# Patient Record
Sex: Male | Born: 1990 | Race: Black or African American | Hispanic: No | Marital: Single | Smoking: Former smoker
Health system: Southern US, Community
[De-identification: ages and names within clinical notes are randomized; demographics above are authoritative.]

## PROBLEM LIST (undated history)

## (undated) DIAGNOSIS — T7840XA Allergy, unspecified, initial encounter: Secondary | ICD-10-CM

## (undated) DIAGNOSIS — J45909 Unspecified asthma, uncomplicated: Secondary | ICD-10-CM

## (undated) HISTORY — DX: Unspecified asthma, uncomplicated: J45.909

## (undated) HISTORY — DX: Allergy, unspecified, initial encounter: T78.40XA

---
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
## 2013-03-15 ENCOUNTER — Telehealth: Payer: Self-pay | Admitting: Internal Medicine

## 2013-03-15 NOTE — Telephone Encounter (Signed)
Pt has an appt 12.31.14.

## 2013-03-15 NOTE — Telephone Encounter (Signed)
ok 

## 2013-03-15 NOTE — Telephone Encounter (Signed)
Pt called stated that he scheduled for new pt last year 10/2011, but he had to cancel the appt due to school. Pt call request to reschedule for new pt and pt need to be seen for coughing maybe next week if this is ok with Dr. Yetta Barre. Please advise.

## 2013-03-20 ENCOUNTER — Ambulatory Visit (INDEPENDENT_AMBULATORY_CARE_PROVIDER_SITE_OTHER): Payer: BC Managed Care – PPO

## 2013-03-20 ENCOUNTER — Telehealth: Payer: Self-pay | Admitting: Internal Medicine

## 2013-03-20 ENCOUNTER — Ambulatory Visit (INDEPENDENT_AMBULATORY_CARE_PROVIDER_SITE_OTHER)
Admission: RE | Admit: 2013-03-20 | Discharge: 2013-03-20 | Disposition: A | Discharge status: Home / Self Care | Payer: BC Managed Care – PPO | Source: Ambulatory Visit | Attending: Internal Medicine | Admitting: Internal Medicine

## 2013-03-20 ENCOUNTER — Ambulatory Visit (INDEPENDENT_AMBULATORY_CARE_PROVIDER_SITE_OTHER): Payer: BC Managed Care – PPO | Admitting: Internal Medicine

## 2013-03-20 ENCOUNTER — Encounter: Payer: Self-pay | Admitting: Internal Medicine

## 2013-03-20 VITALS — BP 160/90 | HR 61 | Temp 98.7°F | Resp 16 | Ht 69.0 in | Wt 183.0 lb

## 2013-03-20 DIAGNOSIS — J4531 Mild persistent asthma with (acute) exacerbation: Secondary | ICD-10-CM

## 2013-03-20 DIAGNOSIS — J4520 Mild intermittent asthma, uncomplicated: Secondary | ICD-10-CM | POA: Insufficient documentation

## 2013-03-20 DIAGNOSIS — F1910 Other psychoactive substance abuse, uncomplicated: Secondary | ICD-10-CM

## 2013-03-20 DIAGNOSIS — R05 Cough: Secondary | ICD-10-CM

## 2013-03-20 DIAGNOSIS — R059 Cough, unspecified: Secondary | ICD-10-CM

## 2013-03-20 DIAGNOSIS — I10 Essential (primary) hypertension: Secondary | ICD-10-CM

## 2013-03-20 DIAGNOSIS — J45901 Unspecified asthma with (acute) exacerbation: Secondary | ICD-10-CM

## 2013-03-20 DIAGNOSIS — I517 Cardiomegaly: Secondary | ICD-10-CM

## 2013-03-20 LAB — CBC WITH DIFFERENTIAL/PLATELET
Eosinophils Absolute: 0.1 10*3/uL (ref 0.0–0.7)
Eosinophils Relative: 1 % (ref 0.0–5.0)
HCT: 46.9 % (ref 39.0–52.0)
Hemoglobin: 16.1 g/dL (ref 13.0–17.0)
Lymphocytes Relative: 36.2 % (ref 12.0–46.0)
Lymphs Abs: 2.5 10*3/uL (ref 0.7–4.0)
MCV: 95.5 fl (ref 78.0–100.0)
Monocytes Relative: 10.7 % (ref 3.0–12.0)
Neutro Abs: 3.6 10*3/uL (ref 1.4–7.7)
Platelets: 162 10*3/uL (ref 150.0–400.0)
WBC: 6.9 10*3/uL (ref 4.5–10.5)

## 2013-03-20 LAB — COMPREHENSIVE METABOLIC PANEL
CO2: 28 mEq/L (ref 19–32)
Calcium: 9.7 mg/dL (ref 8.4–10.5)
Creatinine, Ser: 0.9 mg/dL (ref 0.4–1.5)
GFR: 130.62 mL/min (ref >60.00)
Glucose, Bld: 95 mg/dL (ref 70–99)
Total Bilirubin: 0.5 mg/dL (ref 0.3–1.2)
Total Protein: 7.9 g/dL (ref 6.0–8.3)

## 2013-03-20 LAB — URINALYSIS, ROUTINE W REFLEX MICROSCOPIC
Bilirubin Urine: NEGATIVE
Hgb urine dipstick: NEGATIVE
Ketones, ur: NEGATIVE
Leukocytes, UA: NEGATIVE
Total Protein, Urine: NEGATIVE
Urine Glucose: NEGATIVE
Urobilinogen, UA: 0.2 (ref 0.0–1.0)

## 2013-03-20 LAB — LIPID PANEL
HDL: 49.2 mg/dL (ref >39.00)
LDL Cholesterol: 76 mg/dL (ref 0–99)
Total CHOL/HDL Ratio: 3
Triglycerides: 137 mg/dL (ref 0.0–149.0)
VLDL: 27.4 mg/dL (ref 0.0–40.0)

## 2013-03-20 LAB — TSH: TSH: 2.04 u[IU]/mL (ref 0.35–5.50)

## 2013-03-20 LAB — SEDIMENTATION RATE: Sed Rate: 6 mm/hr (ref 0–22)

## 2013-03-20 MED ORDER — NEBIVOLOL HCL 5 MG PO TABS: 5.0000 mg | ORAL_TABLET | Freq: Every day | ORAL | Status: DC

## 2013-03-20 MED ORDER — MOMETASONE FURO-FORMOTEROL FUM 100-5 MCG/ACT IN AERO: 2.0000 | INHALATION_SPRAY | Freq: Two times a day (BID) | RESPIRATORY_TRACT | Status: DC

## 2013-03-20 NOTE — Assessment & Plan Note (Signed)
His CXR is normal today

## 2013-03-20 NOTE — Assessment & Plan Note (Signed)
I have asked him to start using dulera and to stop smoking

## 2013-03-20 NOTE — Telephone Encounter (Signed)
Pt's mother called wanting information on her son's consult and test that he has been referred for.

## 2013-03-20 NOTE — Patient Instructions (Signed)
Hypertension As your heart beats, it forces blood through your arteries. This force is your blood pressure. If the pressure is too high, it is called hypertension (HTN) or high blood pressure. HTN is dangerous because you may have it and not know it. High blood pressure may mean that your heart has to work harder to pump blood. Your arteries may be narrow or stiff. The extra work puts you at risk for heart disease, stroke, and other problems.  Blood pressure consists of two numbers, a higher number over a lower, 110/72, for example. It is stated as "110 over 72." The ideal is below 120 for the top number (systolic) and under 80 for the bottom (diastolic). Write down your blood pressure today. You should pay close attention to your blood pressure if you have certain conditions such as:  Heart failure.  Prior heart attack.  Diabetes  Chronic kidney disease.  Prior stroke.  Multiple risk factors for heart disease. To see if you have HTN, your blood pressure should be measured while you are seated with your arm held at the level of the heart. It should be measured at least twice. A one-time elevated blood pressure reading (especially in the Emergency Department) does not mean that you need treatment. There may be conditions in which the blood pressure is different between your right and left arms. It is important to see your caregiver soon for a recheck. Most people have essential hypertension which means that there is not a specific cause. This type of high blood pressure may be lowered by changing lifestyle factors such as:  Stress.  Smoking.  Lack of exercise.  Excessive weight.  Drug/tobacco/alcohol use.  Eating less salt. Most people do not have symptoms from high blood pressure until it has caused damage to the body. Effective treatment can often prevent, delay or reduce that damage. TREATMENT  When a cause has been identified, treatment for high blood pressure is directed at the  cause. There are a large number of medications to treat HTN. These fall into several categories, and your caregiver will help you select the medicines that are best for you. Medications may have side effects. You should review side effects with your caregiver. If your blood pressure stays high after you have made lifestyle changes or started on medicines,   Your medication(s) may need to be changed.  Other problems may need to be addressed.  Be certain you understand your prescriptions, and know how and when to take your medicine.  Be sure to follow up with your caregiver within the time frame advised (usually within two weeks) to have your blood pressure rechecked and to review your medications.  If you are taking more than one medicine to lower your blood pressure, make sure you know how and at what times they should be taken. Taking two medicines at the same time can result in blood pressure that is too low. SEEK IMMEDIATE MEDICAL CARE IF:  You develop a severe headache, blurred or changing vision, or confusion.  You have unusual weakness or numbness, or a faint feeling.  You have severe chest or abdominal pain, vomiting, or breathing problems. MAKE SURE YOU:   Understand these instructions.  Will watch your condition.  Will get help right away if you are not doing well or get worse. Document Released: 03/07/2005 Document Revised: 05/30/2011 Document Reviewed: 10/26/2007 [facility] Patient Information 2014 [place], [place]. Asthma, Adult Asthma is a recurring condition in which the airways tighten and narrow. Asthma can  make it difficult to breathe. It can cause coughing, wheezing, and shortness of breath. Asthma episodes (also called asthma attacks) range from minor to life-threatening. Asthma cannot be cured, but medicines and lifestyle changes can help control it. CAUSES Asthma is believed to be caused by inherited (genetic) and environmental factors, but its exact cause is unknown.  Asthma may be triggered by allergens, lung infections, or irritants in the air. Asthma triggers are different for each person. Common triggers include:   Animal dander.  Dust mites.  Cockroaches.  Pollen from trees or grass.  Mold.  Smoke.  Air pollutants such as dust, household cleaners, hair sprays, aerosol sprays, paint fumes, strong chemicals, or strong odors.  Cold air, weather changes, and winds (which increase molds and pollens in the air).  Strong emotional expressions such as crying or laughing hard.  Stress.  Certain medicines (such as aspirin) or types of drugs (such as beta-blockers).  Sulfites in foods and drinks. Foods and drinks that may contain sulfites include dried fruit, potato chips, and sparkling grape juice.  Infections or inflammatory conditions such as the flu, a cold, or an inflammation of the nasal membranes (rhinitis).  Gastroesophageal reflux disease (GERD).  Exercise or strenuous activity. SYMPTOMS Symptoms may occur immediately after asthma is triggered or many hours later. Symptoms include:  Wheezing.  Excessive nighttime or early morning coughing.  Frequent or severe coughing with a common cold.  Chest tightness.  Shortness of breath. DIAGNOSIS  The diagnosis of asthma is made by a review of your medical history and a physical exam. Tests may also be performed. These may include:  Lung function studies. These tests show how much air you breath in and out.  Allergy tests.  Imaging tests such as X-rays. TREATMENT  Asthma cannot be cured, but it can usually be controlled. Treatment involves identifying and avoiding your asthma triggers. It also involves medicines. There are 2 classes of medicine used for asthma treatment:   Controller medicines. These prevent asthma symptoms from occurring. They are usually taken every day.  Reliever or rescue medicines. These quickly relieve asthma symptoms. They are used as needed and provide  short-term relief. Your health care provider will help you create an asthma action plan. An asthma action plan is a written plan for managing and treating your asthma attacks. It includes a list of your asthma triggers and how they may be avoided. It also includes information on when medicines should be taken and when their dosage should be changed. An action plan may also involve the use of a device called a peak flow meter. A peak flow meter measures how well the lungs are working. It helps you monitor your condition. HOME CARE INSTRUCTIONS   Take medicine as directed by your health care provider. Speak with your health care provider if you have questions about how or when to take the medicines.  Use a peak flow meter as directed by your health care provider. Record and keep track of readings.  Understand and use the action plan to help minimize or stop an asthma attack without needing to seek medical care.  Control your home environment in the following ways to help prevent asthma attacks:  Do not smoke. Avoid being exposed to secondhand smoke.  Change your heating and air conditioning filter regularly.  Limit your use of fireplaces and wood stoves.  Get rid of pests (such as roaches and mice) and their droppings.  Throw away plants if you see mold on them.  Clean your floors and dust regularly. Use unscented cleaning products.  Try to have someone else vacuum for you regularly. Stay out of rooms while they are being vacuumed and for a short while afterward. If you vacuum, use a dust mask from a hardware store, a double-layered or microfilter vacuum cleaner bag, or a vacuum cleaner with a HEPA filter.  Replace carpet with wood, tile, or vinyl flooring. Carpet can trap dander and dust.  Use allergy-proof pillows, mattress covers, and box spring covers.  Wash bed sheets and blankets every week in hot water and dry them in a dryer.  Use blankets that are made of polyester or  cotton.  Clean bathrooms and kitchens with bleach. If possible, have someone repaint the walls in these rooms with mold-resistant paint. Keep out of the rooms that are being cleaned and painted.  Wash hands frequently. SEEK MEDICAL CARE IF:   You have wheezing, shortness of breath, or a cough even if taking medicine to prevent attacks.  The colored mucus you cough up (sputum) is thicker than usual.  Your sputum changes from clear or white to yellow, green, gray, or bloody.  You have any problems that may be related to the medicines you are taking (such as a rash, itching, swelling, or trouble breathing).  You are using a reliever medicine more than 2 3 times per week.  Your peak flow is still at 50 79% of you personal best after following your action plan for 1 hour. SEEK IMMEDIATE MEDICAL CARE IF:   You seem to be getting worse and are unresponsive to treatment during an asthma attack.  You are short of breath even at rest.  You get short of breath when doing very little physical activity.  You have difficulty eating, drinking, or talking due to asthma symptoms.  You develop chest pain.  You develop a fast heartbeat.  You have a bluish color to your lips or fingernails.  You are lightheaded, dizzy, or faint.  Your peak flow is less than 50% of your personal best.  You have a fever or persistent symptoms for more than 2 3 days.  You have a fever and symptoms suddenly get worse. MAKE SURE YOU:   Understand these instructions.  Will watch your condition.  Will get help right away if you are not doing well or get worse. Document Released: 03/07/2005 Document Revised: 11/07/2012 Document Reviewed: 10/04/2012 [facility] Patient Information 2014 [place], [place].

## 2013-03-20 NOTE — Progress Notes (Signed)
Subjective:    Patient ID: Joseph Ferrell, male    DOB: 11/11/1990, 22 y.o.   MRN: [id number]  Hypertension Pertinent negatives include no chest pain, headaches, neck pain, palpitations, shortness of breath or sweats.  Cough This is a chronic problem. The current episode started more than 1 year ago. The problem has been unchanged. The problem occurs every few hours. The cough is non-productive. Associated symptoms include postnasal drip, rhinorrhea and wheezing. Pertinent negatives include no chest pain, chills, ear congestion, ear pain, fever, headaches, heartburn, hemoptysis, myalgias, nasal congestion, rash, sore throat, shortness of breath, sweats or weight loss. The symptoms are aggravated by cold air, exercise and pollens. Risk factors for lung disease include smoking/tobacco exposure. He has tried nothing for the symptoms. His past medical history is significant for asthma. There is no history of bronchiectasis, bronchitis, COPD, emphysema or pneumonia.      Review of Systems  Constitutional: Negative.  Negative for fever, chills, weight loss, diaphoresis, activity change, appetite change, fatigue and unexpected weight change.  HENT: Positive for congestion, postnasal drip and rhinorrhea. Negative for dental problem, drooling, ear discharge, ear pain, facial swelling, hearing loss, mouth sores, nosebleeds, sinus pressure, sneezing, sore throat, tinnitus, trouble swallowing and voice change.   Eyes: Negative.   Respiratory: Positive for cough and wheezing. Negative for apnea, hemoptysis, choking, chest tightness and shortness of breath.   Cardiovascular: Negative.  Negative for chest pain, palpitations and leg swelling.  Gastrointestinal: Negative.  Negative for heartburn, nausea, vomiting, abdominal pain, diarrhea, constipation and blood in stool.  Endocrine: Negative.   Genitourinary: Negative.   Musculoskeletal: Negative.  Negative for myalgias, neck pain and neck stiffness.  Skin:  Negative.  Negative for rash.  Allergic/Immunologic: Negative.   Neurological: Negative.  Negative for dizziness and headaches.  Hematological: Negative.  Negative for adenopathy. Does not bruise/bleed easily.  Psychiatric/Behavioral: Negative.        Objective:   Physical Exam  Vitals reviewed. Constitutional: He is oriented to person, place, and time. He appears well-developed and well-nourished.  Non-toxic appearance. He does not have a sickly appearance. He does not appear ill. No distress.  HENT:  Head: Normocephalic and atraumatic.  Mouth/Throat: Oropharynx is clear and moist. No oropharyngeal exudate.  Eyes: Conjunctivae are normal. Right eye exhibits no discharge. Left eye exhibits no discharge. No scleral icterus.  Neck: Normal range of motion. Neck supple. No JVD present. No tracheal deviation present. No thyromegaly present.  Cardiovascular: Normal rate, regular rhythm, S1 normal, S2 normal, normal heart sounds and intact distal pulses.  Exam reveals no gallop and no friction rub.   No murmur heard.  No systolic murmur is present   No diastolic murmur is present  Pulses:      Carotid pulses are 2+ on the right side, and 2+ on the left side.      Radial pulses are 2+ on the right side, and 2+ on the left side.       Femoral pulses are 2+ on the right side, and 2+ on the left side.      Popliteal pulses are 2+ on the right side, and 2+ on the left side.       Dorsalis pedis pulses are 2+ on the right side, and 2+ on the left side.       Posterior tibial pulses are 2+ on the right side, and 2+ on the left side.  Pulmonary/Chest: Effort normal and breath sounds normal. No stridor. No respiratory distress. He  has no wheezes. He has no rales. He exhibits no tenderness.  Abdominal: Soft. Bowel sounds are normal. He exhibits no distension and no mass. There is no tenderness. There is no rebound and no guarding.  Musculoskeletal: Normal range of motion. He exhibits no edema and no  tenderness.  Lymphadenopathy:    He has no cervical adenopathy.  Neurological: He is oriented to person, place, and time.  Skin: Skin is warm and dry. No rash noted. He is not diaphoretic. No erythema. No pallor.  Psychiatric: He has a normal mood and affect. His behavior is normal. Judgment and thought content normal.          Assessment & Plan:

## 2013-03-20 NOTE — Assessment & Plan Note (Signed)
His EKG shows LVH so I have asked him to start bystolic I will check his labs to look for end organ damage and secondary causes of HTN I have asked him to stop smoking and abusing alcohol

## 2013-03-20 NOTE — Progress Notes (Signed)
Pre visit review using our clinic review tool, if applicable. No additional management support is needed unless otherwise documented below in the visit note. 

## 2013-03-20 NOTE — Assessment & Plan Note (Signed)
This was apparent on his EKG today I have asked him to have an ECHO done to see if he has HOCM and to see cardiology for further evaluation

## 2013-03-20 NOTE — Assessment & Plan Note (Signed)
I will check his UDS today to see if he is using anything that would cause HTN Also, I have asked to see someone for substance abuse counseling

## 2013-03-23 LAB — DRUGS OF ABUSE SCREEN W/O ALC, ROUTINE URINE
Amphetamine Screen, Ur: NEGATIVE
Barbiturate Quant, Ur: NEGATIVE
Benzodiazepines.: NEGATIVE
Cocaine Metabolites: NEGATIVE
Creatinine,U: 131.3 mg/dL
Marijuana Metabolite: POSITIVE — AB
Methadone: NEGATIVE
Opiate Screen, Urine: NEGATIVE
Phencyclidine (PCP): NEGATIVE
Propoxyphene: NEGATIVE

## 2013-03-26 LAB — CANNABANOIDS (GC/LC/MS), URINE: THC-COOH (GC/LC/MS), ur confirm: 321 ng/mL

## 2013-04-03 ENCOUNTER — Ambulatory Visit (INDEPENDENT_AMBULATORY_CARE_PROVIDER_SITE_OTHER): Payer: BC Managed Care – PPO | Admitting: Interventional Cardiology

## 2013-04-03 ENCOUNTER — Encounter: Payer: Self-pay | Admitting: Interventional Cardiology

## 2013-04-03 VITALS — BP 130/82 | HR 64 | Ht 68.0 in | Wt 183.0 lb

## 2013-04-03 DIAGNOSIS — F17200 Nicotine dependence, unspecified, uncomplicated: Secondary | ICD-10-CM

## 2013-04-03 DIAGNOSIS — R030 Elevated blood-pressure reading, without diagnosis of hypertension: Secondary | ICD-10-CM

## 2013-04-03 DIAGNOSIS — R0602 Shortness of breath: Secondary | ICD-10-CM

## 2013-04-03 NOTE — Patient Instructions (Signed)
Your physician has requested that you regularly monitor and record your blood pressure readings at home. Please use the same machine at the same time of day to check your readings and record them to bring to your follow-up visit.  Your physician recommends that you schedule a follow-up appointment in 4 weeks with Dr. Eldridge DaceVaranasi.  Echocardiogram scheduled for 04/05/13.

## 2013-04-03 NOTE — Progress Notes (Signed)
Patient ID: Joseph HailstoneKevin R Ferrell, male   DOB: 05/14/1990, 23 y.o.   MRN: [id number]    [address] [place], [place]  [address] Phone: [phone] Fax:  [phone]  Date:  04/03/2013   ID:  Joseph HailstoneKevin R Ferrell, DOB 07/15/1990, MRN [id number]  PCP:  Sanda Lingerhomas Jones, MD      History of Present Illness: Joseph Ferrell is a 23 y.o. male who saw his PMD for a presumed asthma exacerbation, related to smoking.  He had an ECG due to some CP and SHOB.  It was concerning for LVH.  THe patient is here for further evaluation.  There was a concern for HOCM as well based on ECG.  The patient denies chest discomfort and SHOB except for when he had a URI.  No regular exercise.  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  Never had problems with activity.  No family h/o of sudden death in very young people.  GrandFather died at 1741, MI.  GrandMother died or CAD at 4348.   The patient denies any chest discomfort at this time. He has not had any syncope or near syncope. He denies palpitations.  He had asthma as a child and that was his only restriction.  Used inhaler for asthma, but outgrew this.     Wt Readings from Last 3 Encounters:  04/03/13 183 lb (83.008 kg)  03/20/13 183 lb (83.008 kg)     Past Medical History  Diagnosis Date  . Allergy   . Asthma     Current Outpatient Prescriptions  Medication Sig Dispense Refill  . mometasone-formoterol (DULERA) 100-5 MCG/ACT AERO Inhale 2 puffs into the lungs 2 (two) times daily.  1 Inhaler  11  . nebivolol (BYSTOLIC) 5 MG tablet Take 1 tablet (5 mg total) by mouth daily.  35 tablet  0   No current facility-administered medications for this visit.    Allergies:   No Known Allergies  Social History:  The patient  reports that he quit smoking 7 days ago. His smoking use included Cigarettes and Cigars. He has a 4 pack-year smoking history. He does not have any smokeless tobacco history on file. He reports that he drinks about 7.5 ounces of  alcohol per week. He reports that he uses illicit drugs (Marijuana).   Family History:  The patient's family history includes Alcohol abuse in his father; Hypertension in his father and mother.   ROS:  Please see the history of present illness.  No nausea, vomiting.  No fevers, chills.  No focal weakness.  No dysuria.   All other systems reviewed and negative.   PHYSICAL EXAM: VS:  BP 130/82  Pulse 64  Ht 5\' 8"  (1.727 m)  Wt 183 lb (83.008 kg)  BMI 27.83 kg/m2 Well nourished, well developed, in no acute distress HEENT: normal Neck: no JVD, no carotid bruits Cardiac:  normal S1, S2; RRR;  Lungs:  clear to auscultation bilaterally, no wheezing, rhonchi or rales Abd: soft, nontender, no hepatomegaly Ext: no edema Skin: warm and dry Neuro:   no focal abnormalities noted  EKG:  Normal sinus rhythm, increased voltage which may be normal variant given his age    ASSESSMENT AND PLAN:  1. Question of abnormal ECG: He does have an increased QRS voltage. Given his age, this may be a normal variant. An echocardiogram has already been ordered by his primary care physician and will be performed on Friday. We'll see if  he really has LVH or any type of septal hypertrophy that would be concerning. 2. Elevated blood pressure: There several lifestyle habits that he can improve upon which would lower his blood pressure. He has been drinking heavily. He is trying to cut back with this. I asked him to decrease caffeine intake as well. He can continue his bystolic at this point, but hopefully, we'll be able to stop this medication. Given his young age, I don't want to commit him to long-term blood pressure medicines. I've asked him to check his blood pressure outside of the doctor's office. He will also keep a record of this. Workup for possible causes of secondary hypertension is in progress. 3. Tobacco abuse: He needs to stop smoking.  Also, urine drug screen was positive for marijuana.  Signed, Fredric Mare, MD, [facility] 04/03/2013 1:44 PM

## 2013-04-05 ENCOUNTER — Encounter: Payer: Self-pay | Admitting: Cardiovascular Disease

## 2013-04-05 ENCOUNTER — Ambulatory Visit (HOSPITAL_COMMUNITY): Payer: BC Managed Care – PPO | Attending: Cardiovascular Disease | Admitting: Cardiology

## 2013-04-05 DIAGNOSIS — I517 Cardiomegaly: Secondary | ICD-10-CM | POA: Insufficient documentation

## 2013-04-05 DIAGNOSIS — R0609 Other forms of dyspnea: Secondary | ICD-10-CM | POA: Insufficient documentation

## 2013-04-05 DIAGNOSIS — F17200 Nicotine dependence, unspecified, uncomplicated: Secondary | ICD-10-CM | POA: Insufficient documentation

## 2013-04-05 DIAGNOSIS — R0989 Other specified symptoms and signs involving the circulatory and respiratory systems: Secondary | ICD-10-CM | POA: Insufficient documentation

## 2013-04-05 DIAGNOSIS — I10 Essential (primary) hypertension: Secondary | ICD-10-CM | POA: Insufficient documentation

## 2013-04-05 NOTE — Progress Notes (Signed)
Echo performed. 

## 2013-05-06 ENCOUNTER — Ambulatory Visit (INDEPENDENT_AMBULATORY_CARE_PROVIDER_SITE_OTHER): Payer: BC Managed Care – PPO | Admitting: Internal Medicine

## 2013-05-06 ENCOUNTER — Ambulatory Visit: Payer: BC Managed Care – PPO | Admitting: Interventional Cardiology

## 2013-05-06 ENCOUNTER — Encounter: Payer: Self-pay | Admitting: Internal Medicine

## 2013-05-06 VITALS — BP 110/78 | HR 72 | Temp 97.9°F | Resp 16 | Wt 185.1 lb

## 2013-05-06 DIAGNOSIS — I517 Cardiomegaly: Secondary | ICD-10-CM

## 2013-05-06 DIAGNOSIS — I10 Essential (primary) hypertension: Secondary | ICD-10-CM

## 2013-05-06 DIAGNOSIS — Z23 Encounter for immunization: Secondary | ICD-10-CM

## 2013-05-06 MED ORDER — NEBIVOLOL HCL 5 MG PO TABS: 5.0000 mg | ORAL_TABLET | Freq: Every day | ORAL | Status: DC

## 2013-05-06 NOTE — Progress Notes (Signed)
   Subjective:    Patient ID: Joseph Ferrell, male    DOB: 04/15/1990, 23 y.o.   MRN: [id number]  Hypertension This is a chronic problem. The current episode started more than 1 year ago. The problem has been gradually improving since onset. The problem is controlled. Pertinent negatives include no anxiety, blurred vision, chest pain, headaches, malaise/fatigue, neck pain, orthopnea, palpitations, peripheral edema, PND, shortness of breath or sweats. Past treatments include beta blockers. The current treatment provides moderate improvement. There are no compliance problems.       Review of Systems  Constitutional: Negative.  Negative for fever, chills, malaise/fatigue, diaphoresis, appetite change and fatigue.  HENT: Negative.   Eyes: Negative.  Negative for blurred vision.  Respiratory: Negative.  Negative for cough, choking, chest tightness, shortness of breath, wheezing and stridor.   Cardiovascular: Negative.  Negative for chest pain, palpitations, orthopnea, leg swelling and PND.  Gastrointestinal: Negative.  Negative for nausea, vomiting, abdominal pain, diarrhea, constipation and blood in stool.  Endocrine: Negative.   Genitourinary: Negative.   Musculoskeletal: Negative.  Negative for neck pain.  Skin: Negative.   Allergic/Immunologic: Negative.   Neurological: Negative.  Negative for dizziness, syncope, weakness and headaches.  Hematological: Negative.  Negative for adenopathy. Does not bruise/bleed easily.  Psychiatric/Behavioral: Negative.        Objective:   Physical Exam  Vitals reviewed. Constitutional: He is oriented to person, place, and time. He appears well-developed and well-nourished. No distress.  HENT:  Head: Normocephalic and atraumatic.  Mouth/Throat: Oropharynx is clear and moist. No oropharyngeal exudate.  Eyes: Conjunctivae are normal. Right eye exhibits no discharge. Left eye exhibits no discharge. No scleral icterus.  Neck: Normal range of motion. Neck  supple. No JVD present. No tracheal deviation present. No thyromegaly present.  Cardiovascular: Normal rate and intact distal pulses.  Exam reveals no gallop and no friction rub.   No murmur heard. Pulmonary/Chest: Effort normal and breath sounds normal. No stridor. No respiratory distress. He has no wheezes. He has no rales. He exhibits no tenderness.  Abdominal: Soft. Bowel sounds are normal. He exhibits no distension and no mass. There is no tenderness. There is no rebound and no guarding.  Musculoskeletal: Normal range of motion. He exhibits no edema.  Lymphadenopathy:    He has no cervical adenopathy.  Neurological: He is oriented to person, place, and time.  Skin: Skin is warm and dry. No rash noted. He is not diaphoretic. No erythema. No pallor.      Lab Results  Component Value Date   WBC 6.9 03/20/2013   HGB 16.1 03/20/2013   HCT 46.9 03/20/2013   PLT 162.0 03/20/2013   GLUCOSE 95 03/20/2013   CHOL 153 03/20/2013   TRIG 137.0 03/20/2013   HDL 49.20 03/20/2013   LDLCALC 76 03/20/2013   ALT 60* 03/20/2013   AST 36 03/20/2013   NA 137 03/20/2013   K 4.5 03/20/2013   CL 104 03/20/2013   CREATININE 0.9 03/20/2013   BUN 12 03/20/2013   CO2 28 03/20/2013   TSH 2.04 03/20/2013      Assessment & Plan:

## 2013-05-06 NOTE — Assessment & Plan Note (Signed)
His BP is well controlled Will cont bystolic for now

## 2013-05-06 NOTE — Progress Notes (Signed)
Pre-visit discussion using our clinic review tool. No additional management support is needed unless otherwise documented below in the visit note.  

## 2013-05-06 NOTE — Patient Instructions (Signed)

## 2013-05-27 ENCOUNTER — Encounter: Payer: Self-pay | Admitting: Interventional Cardiology

## 2013-05-27 ENCOUNTER — Ambulatory Visit (INDEPENDENT_AMBULATORY_CARE_PROVIDER_SITE_OTHER): Payer: BC Managed Care – PPO | Admitting: Interventional Cardiology

## 2013-05-27 VITALS — BP 122/71 | HR 62 | Ht 69.0 in | Wt 181.0 lb

## 2013-05-27 DIAGNOSIS — I10 Essential (primary) hypertension: Secondary | ICD-10-CM

## 2013-05-27 NOTE — Patient Instructions (Signed)
Your physician recommends that you continue on your current medications as directed. Please refer to the Current Medication list given to you today.  Your physician wants you to follow-up in: 1 year with Dr. Varanasi. You will receive a reminder letter in the mail two months in advance. If you don't receive a letter, please call our office to schedule the follow-up appointment.  

## 2013-05-27 NOTE — Progress Notes (Signed)
Patient ID: Joseph Ferrell, male   DOB: 1990-12-07, 23 y.o.   MRN: [id number] Patient ID: Joseph Ferrell, male   DOB: 1990-09-02, 23 y.o.   MRN: [id number]    [address] [place], [place]  [address] Phone: [phone] Fax:  [phone]  Date:  05/27/2013   ID:  Joseph Ferrell, DOB [birth year], MRN [id number]  PCP:  Sanda Linger, MD      History of Present Illness: Joseph Ferrell is a 23 y.o. male who saw his PMD for a presumed asthma exacerbation, related to smoking.  He had an ECG due to some CP and SHOB.  It was concerning for LVH.  THe patient is here for further evaluation.  There was a concern for HOCM as well based on ECG.  The patient denies chest discomfort and SHOB except for when he had a URI.  No regular exercise.  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  Never had problems with activity.  No family h/o of sudden death in very young people.  GrandFather died at 36, MI.  GrandMother died or CAD at 30.   The patient denies any chest discomfort at this time. He has not had any syncope or near syncope. He denies palpitations.  He had asthma as a child and that was his only restriction.  Used inhaler for asthma, but outgrew this.    He was started on bystolic.  He has felt well.  His BP was checked outside of the MDs office.  He has obtained readings but does not remember the exact readings; he thinks they were ok.    Wt Readings from Last 3 Encounters:  05/27/13 181 lb (82.101 kg)  05/06/13 185 lb 2 oz (83.972 kg)  04/03/13 183 lb (83.008 kg)     Past Medical History  Diagnosis Date  . Allergy   . Asthma     Current Outpatient Prescriptions  Medication Sig Dispense Refill  . loratadine (CLARITIN) 10 MG tablet Take 10 mg by mouth daily.      . mometasone-formoterol (DULERA) 100-5 MCG/ACT AERO Inhale 2 puffs into the lungs 2 (two) times daily.  1 Inhaler  11  . nebivolol (BYSTOLIC) 5 MG tablet Take 1 tablet (5 mg total) by mouth daily.  90 tablet  3    No current facility-administered medications for this visit.    Allergies:   No Known Allergies  Social History:  The patient  reports that he has been smoking Cigarettes and Cigars.  He has a 4 pack-year smoking history. He has never used smokeless tobacco. He reports that he drinks about 7.5 ounces of alcohol per week. He reports that he uses illicit drugs (Marijuana).   Family History:  The patient's family history includes Alcohol abuse in his father; Hypertension in his father and mother.   ROS:  Please see the history of present illness.  No nausea, vomiting.  No fevers, chills.  No focal weakness.  No dysuria.   All other systems reviewed and negative.   PHYSICAL EXAM: VS:  BP 122/71  Pulse 62  Ht 5\' 9"  (1.753 m)  Wt 181 lb (82.101 kg)  BMI 26.72 kg/m2 Well nourished, well developed, in no acute distress HEENT: normal Neck: no JVD, no carotid bruits Cardiac:  normal S1, S2; RRR;  Lungs:  clear to auscultation bilaterally, no wheezing, rhonchi or rales Abd: soft, nontender, no hepatomegaly Ext: no edema Skin: warm and dry  Neuro:   no focal abnormalities noted  EKG:  Normal sinus rhythm, increased voltage which may be normal variant given his age    ASSESSMENT AND PLAN:  1. Question of abnormal ECG: He does have an increased QRS voltage. This may be a normal variant. An echocardiogram has  been done and showed no LVH or any type of septal hypertrophy that would be concerning.  2. Elevated blood pressure: There several lifestyle habits that he can improve upon which would lower his blood pressure. He has been drinking heavily. He is trying to cut back with this. I asked him to decrease caffeine intake as well. He can continue his bystolic at this point, but hopefully, we'll be able to stop this medication. Given his young age, I don't want to commit him to long-term blood pressure medicines. I've asked him to check his blood pressure outside of the doctor's office. He will  also keep a record of this.   3. Tobacco abuse: He needs to stop smoking.  Also, urine drug screen was positive for marijuana in the past.  Signed, Fredric MareJay S. Indy Kuck, MD, [facility] 05/27/2013 12:26 PM

## 2014-05-23 ENCOUNTER — Other Ambulatory Visit (INDEPENDENT_AMBULATORY_CARE_PROVIDER_SITE_OTHER): Payer: BC Managed Care – PPO

## 2014-05-23 ENCOUNTER — Encounter: Payer: Self-pay | Admitting: Internal Medicine

## 2014-05-23 ENCOUNTER — Ambulatory Visit (INDEPENDENT_AMBULATORY_CARE_PROVIDER_SITE_OTHER): Payer: BC Managed Care – PPO | Admitting: Internal Medicine

## 2014-05-23 VITALS — BP 154/100 | HR 96 | Temp 98.3°F | Resp 16 | Ht 69.0 in | Wt 199.5 lb

## 2014-05-23 DIAGNOSIS — I10 Essential (primary) hypertension: Secondary | ICD-10-CM

## 2014-05-23 DIAGNOSIS — Z0000 Encounter for general adult medical examination without abnormal findings: Secondary | ICD-10-CM

## 2014-05-23 DIAGNOSIS — Z23 Encounter for immunization: Secondary | ICD-10-CM

## 2014-05-23 DIAGNOSIS — I517 Cardiomegaly: Secondary | ICD-10-CM

## 2014-05-23 DIAGNOSIS — J4520 Mild intermittent asthma, uncomplicated: Secondary | ICD-10-CM

## 2014-05-23 LAB — COMPREHENSIVE METABOLIC PANEL
ALBUMIN: 4.5 g/dL (ref 3.5–5.2)
ALT: 35 U/L (ref 0–53)
AST: 23 U/L (ref 0–37)
Alkaline Phosphatase: 55 U/L (ref 39–117)
BILIRUBIN TOTAL: 0.4 mg/dL (ref 0.2–1.2)
BUN: 10 mg/dL (ref 6–23)
CO2: 29 mEq/L (ref 19–32)
Calcium: 9.4 mg/dL (ref 8.4–10.5)
Chloride: 105 mEq/L (ref 96–112)
Creatinine, Ser: 1.02 mg/dL (ref 0.40–1.50)
GFR: 117.54 mL/min (ref >60.00)
GLUCOSE: 93 mg/dL (ref 70–99)
Potassium: 4.4 mEq/L (ref 3.5–5.1)
SODIUM: 137 meq/L (ref 135–145)
TOTAL PROTEIN: 7.2 g/dL (ref 6.0–8.3)

## 2014-05-23 LAB — LIPID PANEL
CHOL/HDL RATIO: 2
Cholesterol: 112 mg/dL (ref 0–200)
HDL: 53.7 mg/dL (ref >39.00)
LDL Cholesterol: 47 mg/dL (ref 0–99)
NONHDL: 58.3
Triglycerides: 58 mg/dL (ref 0.0–149.0)
VLDL: 11.6 mg/dL (ref 0.0–40.0)

## 2014-05-23 LAB — CBC WITH DIFFERENTIAL/PLATELET
BASOS ABS: 0 10*3/uL (ref 0.0–0.1)
Basophils Relative: 0.3 % (ref 0.0–3.0)
EOS ABS: 0.1 10*3/uL (ref 0.0–0.7)
Eosinophils Relative: 1.2 % (ref 0.0–5.0)
HEMATOCRIT: 46.5 % (ref 39.0–52.0)
Hemoglobin: 16 g/dL (ref 13.0–17.0)
LYMPHS ABS: 2.2 10*3/uL (ref 0.7–4.0)
Lymphocytes Relative: 35.3 % (ref 12.0–46.0)
MCHC: 34.4 g/dL (ref 30.0–36.0)
MCV: 95.8 fl (ref 78.0–100.0)
MONO ABS: 0.8 10*3/uL (ref 0.1–1.0)
Monocytes Relative: 12.1 % — ABNORMAL HIGH (ref 3.0–12.0)
NEUTROS PCT: 51.1 % (ref 43.0–77.0)
Neutro Abs: 3.2 10*3/uL (ref 1.4–7.7)
Platelets: 181 10*3/uL (ref 150.0–400.0)
RBC: 4.85 Mil/uL (ref 4.22–5.81)
RDW: 13.2 % (ref 11.5–15.5)
WBC: 6.2 10*3/uL (ref 4.0–10.5)

## 2014-05-23 LAB — URINALYSIS, ROUTINE W REFLEX MICROSCOPIC
Bilirubin Urine: NEGATIVE
HGB URINE DIPSTICK: NEGATIVE
KETONES UR: NEGATIVE
Leukocytes, UA: NEGATIVE
Nitrite: NEGATIVE
RBC / HPF: NONE SEEN (ref 0–?)
Specific Gravity, Urine: 1.02 (ref 1.000–1.030)
TOTAL PROTEIN, URINE-UPE24: NEGATIVE
URINE GLUCOSE: NEGATIVE
UROBILINOGEN UA: 0.2 (ref 0.0–1.0)
pH: 6.5 (ref 5.0–8.0)

## 2014-05-23 LAB — TSH: TSH: 0.75 u[IU]/mL (ref 0.35–4.50)

## 2014-05-23 MED ORDER — LORATADINE 10 MG PO TABS: 10.0000 mg | ORAL_TABLET | Freq: Every day | ORAL | Status: DC

## 2014-05-23 MED ORDER — NEBIVOLOL HCL 5 MG PO TABS: 5.0000 mg | ORAL_TABLET | Freq: Every day | ORAL | Status: DC

## 2014-05-23 MED ORDER — MOMETASONE FURO-FORMOTEROL FUM 100-5 MCG/ACT IN AERO: 2.0000 | INHALATION_SPRAY | Freq: Two times a day (BID) | RESPIRATORY_TRACT | Status: DC

## 2014-05-23 NOTE — Patient Instructions (Signed)

## 2014-05-23 NOTE — Progress Notes (Signed)
Pre visit review using our clinic review tool, if applicable. No additional management support is needed unless otherwise documented below in the visit note. 

## 2014-05-23 NOTE — Progress Notes (Signed)
   Subjective:    Patient ID: Joseph Ferrell, male    DOB: 07/29/1990, 24 y.o.   MRN: [id number]  Hypertension This is a chronic problem. The problem is unchanged. The problem is uncontrolled. Pertinent negatives include no anxiety, blurred vision, chest pain, headaches, malaise/fatigue, neck pain, orthopnea, palpitations, peripheral edema, PND, shortness of breath or sweats. Past treatments include beta blockers. The current treatment provides no improvement. Compliance problems include psychosocial issues (he has not been taking the bystolic).  Hypertensive end-organ damage includes left ventricular hypertrophy.      Review of Systems  Constitutional: Negative.  Negative for fever, chills, malaise/fatigue, diaphoresis, appetite change and fatigue.  HENT: Negative.   Eyes: Negative.  Negative for blurred vision.  Respiratory: Negative.  Negative for cough, choking, chest tightness, shortness of breath and stridor.   Cardiovascular: Negative.  Negative for chest pain, palpitations, orthopnea, leg swelling and PND.  Gastrointestinal: Negative.  Negative for nausea, vomiting, abdominal pain, diarrhea, constipation and blood in stool.  Endocrine: Negative.   Genitourinary: Negative.   Musculoskeletal: Negative.  Negative for myalgias, back pain, arthralgias and neck pain.  Skin: Negative.  Negative for rash.  Allergic/Immunologic: Negative.   Neurological: Negative.  Negative for headaches.  Hematological: Negative.  Negative for adenopathy. Does not bruise/bleed easily.  Psychiatric/Behavioral: Negative.        Objective:   Physical Exam  Constitutional: He is oriented to person, place, and time. He appears well-developed and well-nourished. No distress.  HENT:  Head: Normocephalic and atraumatic.  Mouth/Throat: Oropharynx is clear and moist. No oropharyngeal exudate.  Eyes: Conjunctivae are normal. Right eye exhibits no discharge. Left eye exhibits no discharge. No scleral icterus.    Neck: Normal range of motion. Neck supple. No JVD present. No tracheal deviation present. No thyromegaly present.  Cardiovascular: Normal rate, regular rhythm, normal heart sounds and intact distal pulses.  Exam reveals no gallop and no friction rub.   No murmur heard. Pulmonary/Chest: Effort normal and breath sounds normal. No stridor. No respiratory distress. He has no wheezes. He has no rales. He exhibits no tenderness.  Abdominal: Soft. Bowel sounds are normal. He exhibits no distension and no mass. There is no tenderness. There is no rebound and no guarding.  Musculoskeletal: Normal range of motion. He exhibits no edema or tenderness.  Lymphadenopathy:    He has no cervical adenopathy.  Neurological: He is oriented to person, place, and time.  Skin: Skin is warm and dry. No rash noted. He is not diaphoretic. No erythema. No pallor.  Psychiatric: He has a normal mood and affect. His behavior is normal. Judgment and thought content normal.  Nursing note and vitals reviewed.   Lab Results  Component Value Date   WBC 6.2 05/23/2014   HGB 16.0 05/23/2014   HCT 46.5 05/23/2014   PLT 181.0 05/23/2014   GLUCOSE 93 05/23/2014   CHOL 112 05/23/2014   TRIG 58.0 05/23/2014   HDL 53.70 05/23/2014   LDLCALC 47 05/23/2014   ALT 35 05/23/2014   AST 23 05/23/2014   NA 137 05/23/2014   K 4.4 05/23/2014   CL 105 05/23/2014   CREATININE 1.02 05/23/2014   BUN 10 05/23/2014   CO2 29 05/23/2014   TSH 0.75 05/23/2014        Assessment & Plan:

## 2014-05-25 ENCOUNTER — Encounter: Payer: Self-pay | Admitting: Internal Medicine

## 2014-05-25 NOTE — Assessment & Plan Note (Signed)
Asthma is well controlled on dulera

## 2014-05-25 NOTE — Assessment & Plan Note (Signed)
Vaccines were reviewed and updated Exam done Labs ordered Pt ed material was given 

## 2014-05-25 NOTE — Assessment & Plan Note (Signed)
He has not been treating the HTN and his BP is not well controlled Will start bystolic Will check his labs today to screen for end organ damage and secondary causes of HTN

## 2015-08-14 IMAGING — CR DG CHEST 2V
2 series · 2 of 2 positions shown · non-contrast
Comparison: None.

CLINICAL DATA: Cough and shortness of breath

EXAM:
CHEST  2 VIEW

[view not recorded (1 of 2)]
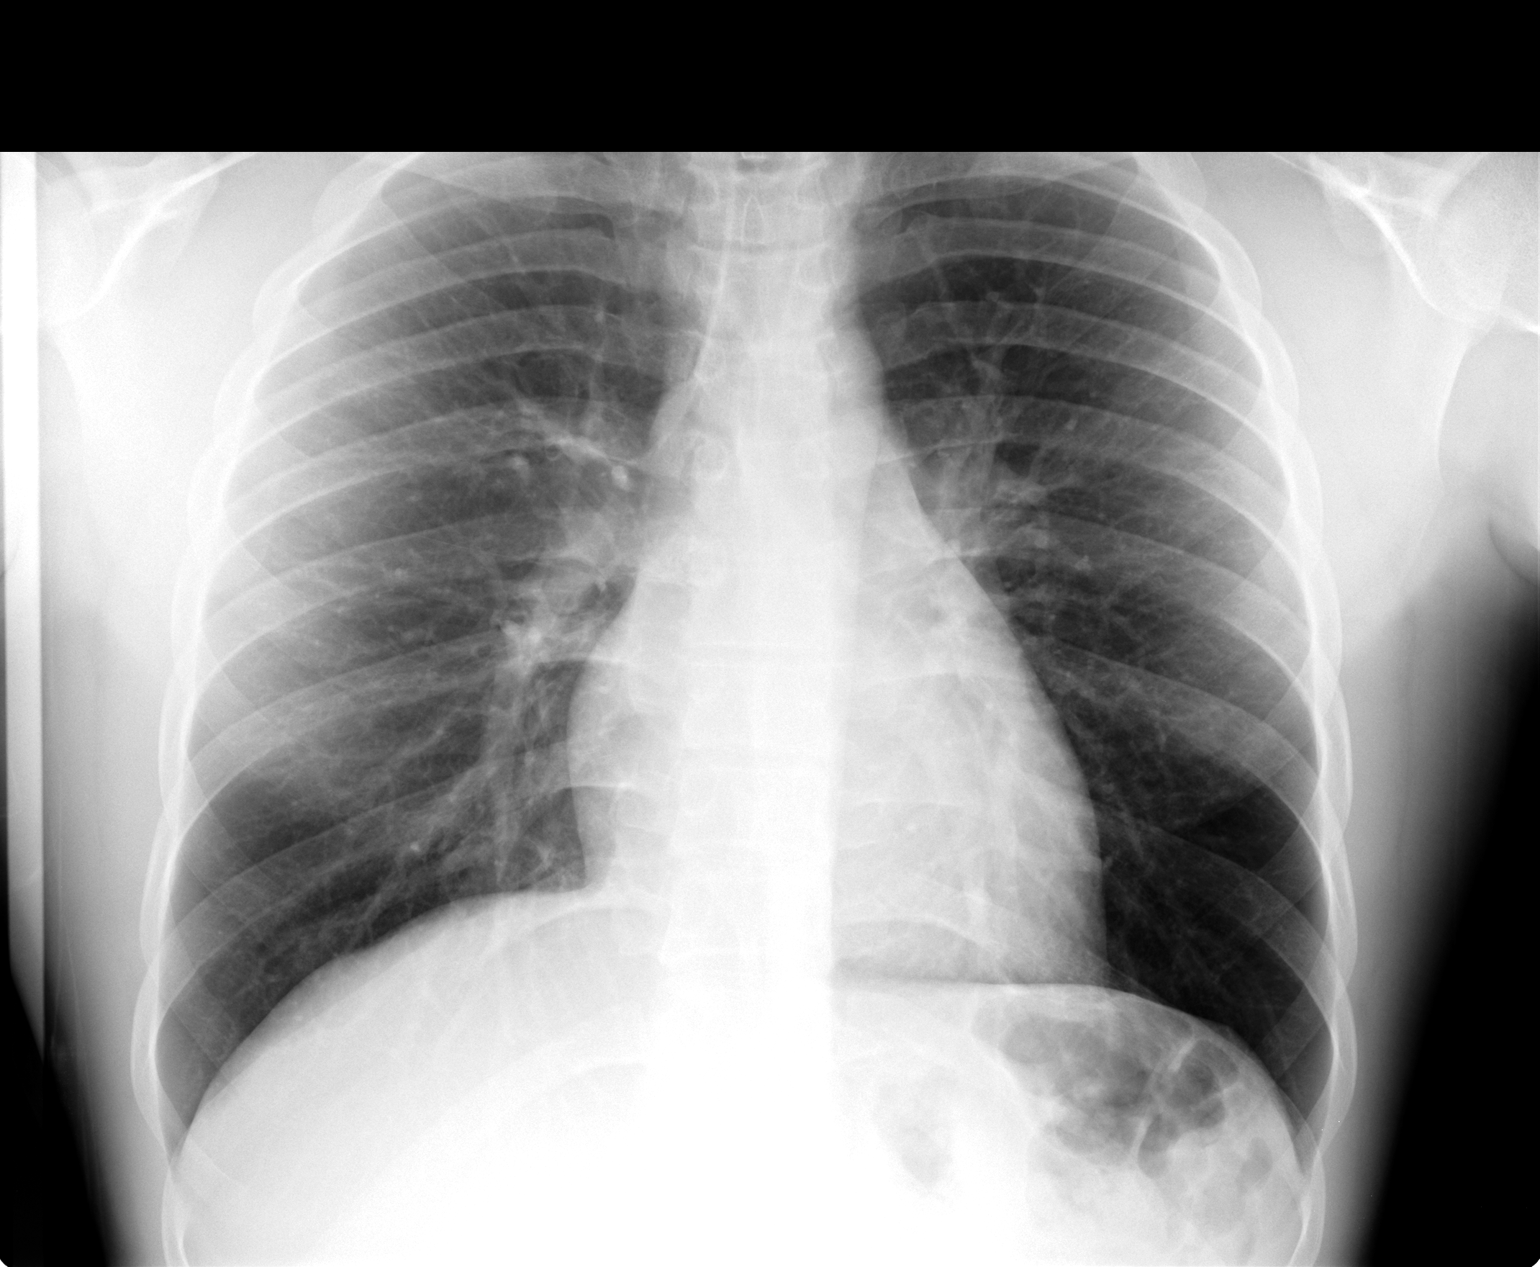

[view not recorded (2 of 2)]
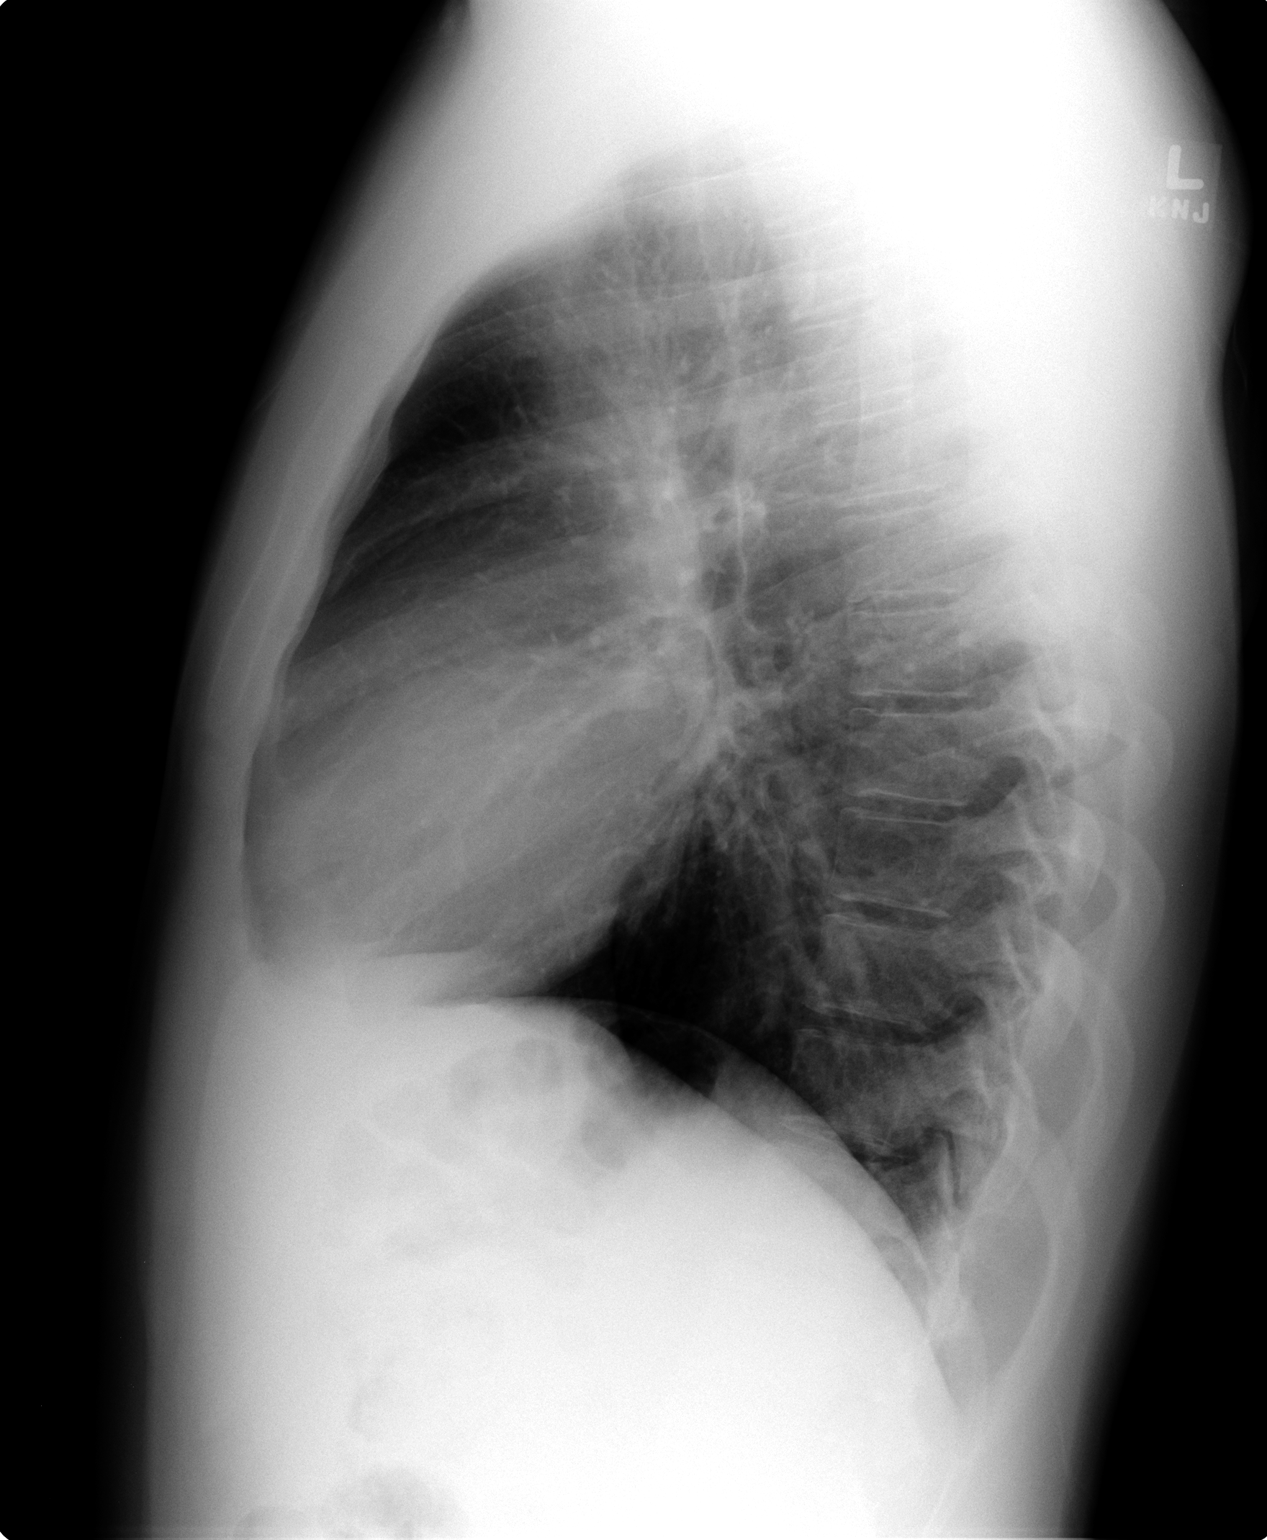

[2 of 2 positions shown; findings below may reference images not displayed]

FINDINGS: The heart size and mediastinal contours are within normal limits.
Both lungs are clear. The visualized skeletal structures are
unremarkable.
IMPRESSION: No active cardiopulmonary disease.

## 2015-12-10 ENCOUNTER — Ambulatory Visit (INDEPENDENT_AMBULATORY_CARE_PROVIDER_SITE_OTHER): Payer: BC Managed Care – PPO | Admitting: Internal Medicine

## 2015-12-10 VITALS — BP 120/72 | HR 70 | Temp 98.0°F | Resp 20 | Wt 206.0 lb

## 2015-12-10 DIAGNOSIS — J4521 Mild intermittent asthma with (acute) exacerbation: Secondary | ICD-10-CM | POA: Diagnosis not present

## 2015-12-10 DIAGNOSIS — J45901 Unspecified asthma with (acute) exacerbation: Secondary | ICD-10-CM | POA: Insufficient documentation

## 2015-12-10 DIAGNOSIS — J4520 Mild intermittent asthma, uncomplicated: Secondary | ICD-10-CM

## 2015-12-10 DIAGNOSIS — I10 Essential (primary) hypertension: Secondary | ICD-10-CM

## 2015-12-10 MED ORDER — HYDROCODONE-HOMATROPINE 5-1.5 MG/5ML PO SYRP
5.0000 mL | ORAL_SOLUTION | Freq: Four times a day (QID) | ORAL | 0 refills | Status: AC | PRN
Start: 2015-12-10 — End: 2015-12-20

## 2015-12-10 MED ORDER — MOMETASONE FURO-FORMOTEROL FUM 100-5 MCG/ACT IN AERO: 2.0000 | INHALATION_SPRAY | Freq: Two times a day (BID) | RESPIRATORY_TRACT | 11 refills | Status: DC

## 2015-12-10 MED ORDER — PREDNISONE 10 MG PO TABS: ORAL_TABLET | ORAL | 0 refills | Status: DC

## 2015-12-10 MED ORDER — ALBUTEROL SULFATE HFA 108 (90 BASE) MCG/ACT IN AERS: 2.0000 | INHALATION_SPRAY | Freq: Four times a day (QID) | RESPIRATORY_TRACT | 11 refills | Status: DC | PRN

## 2015-12-10 MED ORDER — AZITHROMYCIN 250 MG PO TABS: ORAL_TABLET | ORAL | 1 refills | Status: DC

## 2015-12-10 NOTE — Progress Notes (Signed)
Pre visit review using our clinic review tool, if applicable. No additional management support is needed unless otherwise documented below in the visit note. 

## 2015-12-10 NOTE — Patient Instructions (Addendum)
Please take all new medication as prescribed - the antibiotic, cough medicine if needed, and the prednisone  Please continue all other medications as before, and refills have been done if requested - the 2 inhalers (albuterol and dulera)  Please have the pharmacy call with any other refills you may need.  Please keep your appointments with your specialists as you may have planned

## 2015-12-12 NOTE — Assessment & Plan Note (Signed)
Mild persistent, for restart chronic inhalerm to f/u any worsening symptoms or concerns

## 2015-12-12 NOTE — Progress Notes (Signed)
   Subjective:    Patient ID: Joseph Ferrell, male    DOB: 03/26/1990, 25 y.o.   MRN: [id number]  HPI  Here with acute onset mild to mod 2-3 days ST, HA, general weakness and malaise, with prod cough greenish sputum, but Pt denies chest pain, increased sob or doe, wheezing, orthopnea, PND, increased LE swelling, palpitations, dizziness or syncope, except for onset mild wheezing/ sob since last pm.  Has  Been struggling with mild intermittent wheezing as has been out of inhalers for several weeks  Pt denies new neurological symptoms such as new headache, or facial or extremity weakness or numbness   Pt denies polydipsia, polyuria, Past Medical History:  Diagnosis Date  . Allergy   . Asthma    No past surgical history on file.  reports that he has been smoking Cigarettes and Cigars.  He has a 4.00 pack-year smoking history. He has never used smokeless tobacco. He reports that he drinks about 7.5 oz of alcohol per week . He reports that he uses drugs, including Marijuana. family history includes Alcohol abuse in his father; Hypertension in his father and mother. No Known Allergies Current Outpatient Prescriptions on File Prior to Visit  Medication Sig Dispense Refill  . loratadine (CLARITIN) 10 MG tablet Take 1 tablet (10 mg total) by mouth daily. 30 tablet 11  . nebivolol (BYSTOLIC) 5 MG tablet Take 1 tablet (5 mg total) by mouth daily. 90 tablet 3   No current facility-administered medications on file prior to visit.    Review of Systems  Constitutional: Negative for unusual diaphoresis or night sweats HENT: Negative for ear swelling or discharge Eyes: Negative for worsening visual haziness  Respiratory: Negative for choking and stridor.   Gastrointestinal: Negative for distension or worsening eructation Genitourinary: Negative for retention or change in urine volume.  Musculoskeletal: Negative for other MSK pain or swelling Skin: Negative for color change and worsening  wound Neurological: Negative for tremors and numbness other than noted  Psychiatric/Behavioral: Negative for decreased concentration or agitation other than above       Objective:   Physical Exam BP 120/72   Pulse 70   Temp 98 F (36.7 C) (Oral)   Resp 20   Wt 206 lb (93.4 kg)   SpO2 96%   BMI 30.42 kg/m  VS noted, mild ill Constitutional: Pt appears in no apparent distress HENT: Head: NCAT.  Right Ear: External ear normal.  Left Ear: External ear normal.  Eyes: . Pupils are equal, round, and reactive to light. Conjunctivae and EOM are normal Neck: Normal range of motion. Neck supple.  Cardiovascular: Normal rate and regular rhythm.   Pulmonary/Chest: Effort normal and breath sounds decreased without rales but with few scattered wheezing.  Neurological: Pt is alert. Not confused , motor grossly intact Skin: Skin is warm. No rash, no LE edema Psychiatric: Pt behavior is normal. No agitation.     Assessment & Plan:

## 2015-12-12 NOTE — Assessment & Plan Note (Signed)
Mild to mod likely infectious related, for antibx course, cough med prn use and predpacasd,  to f/u any worsening symptoms or concerns

## 2015-12-12 NOTE — Assessment & Plan Note (Signed)
stable overall by history and exam, recent data reviewed with pt, and pt to continue medical treatment as before,  to f/u any worsening symptoms or concerns BP Readings from Last 3 Encounters:  12/10/15 120/72  05/23/14 (!) 154/100  05/27/13 122/71

## 2016-01-04 ENCOUNTER — Ambulatory Visit (INDEPENDENT_AMBULATORY_CARE_PROVIDER_SITE_OTHER): Payer: BC Managed Care – PPO | Admitting: Internal Medicine

## 2016-01-04 ENCOUNTER — Encounter: Payer: Self-pay | Admitting: Internal Medicine

## 2016-01-04 VITALS — BP 136/80 | HR 75 | Temp 98.6°F | Resp 16 | Ht 69.0 in | Wt 217.0 lb

## 2016-01-04 DIAGNOSIS — I10 Essential (primary) hypertension: Secondary | ICD-10-CM

## 2016-01-04 DIAGNOSIS — I517 Cardiomegaly: Secondary | ICD-10-CM | POA: Diagnosis not present

## 2016-01-04 DIAGNOSIS — Z0000 Encounter for general adult medical examination without abnormal findings: Secondary | ICD-10-CM | POA: Diagnosis not present

## 2016-01-04 DIAGNOSIS — J4520 Mild intermittent asthma, uncomplicated: Secondary | ICD-10-CM | POA: Diagnosis not present

## 2016-01-04 MED ORDER — NEBIVOLOL HCL 5 MG PO TABS: 5.0000 mg | ORAL_TABLET | Freq: Every day | ORAL | 3 refills | Status: DC

## 2016-01-04 MED ORDER — ALBUTEROL SULFATE HFA 108 (90 BASE) MCG/ACT IN AERS: 2.0000 | INHALATION_SPRAY | Freq: Four times a day (QID) | RESPIRATORY_TRACT | 11 refills | Status: DC | PRN

## 2016-01-04 MED ORDER — MOMETASONE FURO-FORMOTEROL FUM 100-5 MCG/ACT IN AERO: 2.0000 | INHALATION_SPRAY | Freq: Two times a day (BID) | RESPIRATORY_TRACT | 11 refills | Status: DC

## 2016-01-04 NOTE — Patient Instructions (Signed)

## 2016-01-04 NOTE — Progress Notes (Signed)
Pre visit review using our clinic review tool, if applicable. No additional management support is needed unless otherwise documented below in the visit note. 

## 2016-01-04 NOTE — Progress Notes (Signed)
Subjective:  Patient ID: Joseph Ferrell, male    DOB: 1991-02-24  Age: 25 y.o. MRN: [id number]  CC: Annual Exam; Hypertension; and Asthma   HPI Joseph Ferrell presents for a CPX.  He was recently seen for an upper respiratory infection and flareup of his asthma and his completed course of Zithromax and prednisone. He states that he is feeling much better and his asthma is well controlled on albuterol and Dulera. He has had a few episodes of wheezing but he denies cough, chest pain, hemoptysis, fever, or chills.  He tells me his blood pressure has been well controlled on nebivolol. He has had no recent episodes of headache/blurred vision/chest pain/shortness of breath,/palpitations/edema/or fatigue.  Outpatient Medications Prior to Visit  Medication Sig Dispense Refill  . loratadine (CLARITIN) 10 MG tablet Take 1 tablet (10 mg total) by mouth daily. 30 tablet 11  . albuterol (PROVENTIL HFA;VENTOLIN HFA) 108 (90 Base) MCG/ACT inhaler Inhale 2 puffs into the lungs every 6 (six) hours as needed for wheezing or shortness of breath. 1 Inhaler 11  . mometasone-formoterol (DULERA) 100-5 MCG/ACT AERO Inhale 2 puffs into the lungs 2 (two) times daily. 1 Inhaler 11  . nebivolol (BYSTOLIC) 5 MG tablet Take 1 tablet (5 mg total) by mouth daily. 90 tablet 3  . azithromycin (ZITHROMAX Z-PAK) 250 MG tablet Use asd 6 tablet 1  . predniSONE (DELTASONE) 10 MG tablet 3 tabs by mouth per day for 3 days,2tabs per day for 3 days,1tab per day for 3 days 18 tablet 0   No facility-administered medications prior to visit.     ROS Review of Systems  Constitutional: Negative.  Negative for activity change, appetite change, chills, fatigue and unexpected weight change.  HENT: Negative.   Eyes: Negative.  Negative for photophobia and visual disturbance.  Respiratory: Positive for wheezing. Negative for apnea, cough, choking, chest tightness, shortness of breath and stridor.   Cardiovascular: Negative.  Negative  for chest pain and leg swelling.  Gastrointestinal: Negative.  Negative for abdominal pain, constipation, diarrhea, nausea and vomiting.  Endocrine: Negative.   Genitourinary: Negative.  Negative for decreased urine volume, dysuria, scrotal swelling and testicular pain.  Musculoskeletal: Negative.  Negative for back pain and myalgias.  Skin: Negative.   Allergic/Immunologic: Negative.   Neurological: Negative.  Negative for dizziness.  Hematological: Negative.  Negative for adenopathy. Does not bruise/bleed easily.  Psychiatric/Behavioral: Negative.     Objective:  BP 136/80 (BP Location: Left Arm, Patient Position: Sitting, Cuff Size: Normal)   Pulse 75   Temp 98.6 F (37 C) (Oral)   Resp 16   Ht 5\' 9"  (1.753 m)   Wt 217 lb (98.4 kg)   SpO2 96%   BMI 32.05 kg/m   BP Readings from Last 3 Encounters:  01/04/16 136/80  12/10/15 120/72  05/23/14 (!) 154/100    Wt Readings from Last 3 Encounters:  01/04/16 217 lb (98.4 kg)  12/10/15 206 lb (93.4 kg)  05/23/14 199 lb 8 oz (90.5 kg)    Physical Exam  Constitutional: He is oriented to person, place, and time. No distress.  HENT:  Mouth/Throat: Oropharynx is clear and moist. No oropharyngeal exudate.  Eyes: Conjunctivae are normal. Right eye exhibits no discharge. Left eye exhibits no discharge. No scleral icterus.  Neck: Normal range of motion. Neck supple. No JVD present. No tracheal deviation present. No thyromegaly present.  Cardiovascular: Normal rate, regular rhythm, normal heart sounds and intact distal pulses.  Exam reveals no gallop and  no friction rub.   No murmur heard. Pulmonary/Chest: Effort normal and breath sounds normal. No stridor. No respiratory distress. He has no wheezes. He has no rales. He exhibits no tenderness.  Abdominal: Soft. Bowel sounds are normal. He exhibits no distension and no mass. There is no tenderness. There is no rebound and no guarding. Hernia confirmed negative in the right inguinal area  and confirmed negative in the left inguinal area.  Genitourinary: Testes normal and penis normal. Right testis shows no mass, no swelling and no tenderness. Right testis is descended. Left testis shows no swelling and no tenderness. Left testis is descended. Circumcised. No penile erythema or penile tenderness. No discharge found.  Musculoskeletal: Normal range of motion. He exhibits no edema, tenderness or deformity.  Lymphadenopathy:    He has no cervical adenopathy.       Right: No inguinal adenopathy present.       Left: No inguinal adenopathy present.  Neurological: He is oriented to person, place, and time.  Skin: Skin is warm and dry. No rash noted. He is not diaphoretic. No erythema. No pallor.  Psychiatric: He has a normal mood and affect. His behavior is normal. Judgment and thought content normal.  Vitals reviewed.   Lab Results  Component Value Date   WBC 6.2 05/23/2014   HGB 16.0 05/23/2014   HCT 46.5 05/23/2014   PLT 181.0 05/23/2014   GLUCOSE 93 05/23/2014   CHOL 112 05/23/2014   TRIG 58.0 05/23/2014   HDL 53.70 05/23/2014   LDLCALC 47 05/23/2014   ALT 35 05/23/2014   AST 23 05/23/2014   NA 137 05/23/2014   K 4.4 05/23/2014   CL 105 05/23/2014   CREATININE 1.02 05/23/2014   BUN 10 05/23/2014   CO2 29 05/23/2014   TSH 0.75 05/23/2014    Dg Chest 2 View  Result Date: 03/20/2013 CLINICAL DATA:  Cough and shortness of breath EXAM: CHEST  2 VIEW COMPARISON:  None. FINDINGS: The heart size and mediastinal contours are within normal limits. Both lungs are clear. The visualized skeletal structures are unremarkable. IMPRESSION: No active cardiopulmonary disease. Electronically Signed   By: Alcide Clever M.D.   On: 03/20/2013 10:37    Assessment & Plan:   Joseph Ferrell was seen today for annual exam, hypertension and asthma.  Diagnoses and all orders for this visit:  Routine general medical examination at a health care facility- exam completed, labs ordered, he refused a  flu vaccine, patient education material was given. -     Lipid panel; Future -     HIV antibody; Future -     Comprehensive metabolic panel; Future -     CBC with Differential/Platelet; Future -     TSH; Future  Asthma, intermittent, uncomplicated- his symptoms are well controlled on the combination of a short acting beta agonist in addition to an ICS/LABA -     albuterol (PROVENTIL HFA;VENTOLIN HFA) 108 (90 Base) MCG/ACT inhaler; Inhale 2 puffs into the lungs every 6 (six) hours as needed for wheezing or shortness of breath. -     mometasone-formoterol (DULERA) 100-5 MCG/ACT AERO; Inhale 2 puffs into the lungs 2 (two) times daily.  Essential hypertension, benign- his blood pressure is well-controlled -     nebivolol (BYSTOLIC) 5 MG tablet; Take 1 tablet (5 mg total) by mouth daily.  LVH (left ventricular hypertrophy) -     nebivolol (BYSTOLIC) 5 MG tablet; Take 1 tablet (5 mg total) by mouth daily.  Mild intermittent asthma without  complication   I have discontinued Joseph Ferrell's azithromycin and predniSONE. I am also having him maintain his loratadine, albuterol, mometasone-formoterol, and nebivolol.  Meds ordered this encounter  Medications  . albuterol (PROVENTIL HFA;VENTOLIN HFA) 108 (90 Base) MCG/ACT inhaler    Sig: Inhale 2 puffs into the lungs every 6 (six) hours as needed for wheezing or shortness of breath.    Dispense:  1 Inhaler    Refill:  11  . mometasone-formoterol (DULERA) 100-5 MCG/ACT AERO    Sig: Inhale 2 puffs into the lungs 2 (two) times daily.    Dispense:  1 Inhaler    Refill:  11  . nebivolol (BYSTOLIC) 5 MG tablet    Sig: Take 1 tablet (5 mg total) by mouth daily.    Dispense:  90 tablet    Refill:  3     Follow-up: Return in about 6 months (around 07/04/2016).  Sanda Lingerhomas Jones, MD

## 2017-07-05 ENCOUNTER — Ambulatory Visit: Payer: BC Managed Care – PPO | Admitting: Internal Medicine

## 2017-07-06 ENCOUNTER — Encounter (HOSPITAL_COMMUNITY): Payer: Self-pay | Admitting: Emergency Medicine

## 2017-07-06 ENCOUNTER — Ambulatory Visit (HOSPITAL_COMMUNITY)
Admission: EM | Admit: 2017-07-06 | Discharge: 2017-07-06 | Disposition: A | Discharge status: Home / Self Care | Payer: BC Managed Care – PPO | Attending: Family Medicine | Admitting: Family Medicine

## 2017-07-06 ENCOUNTER — Other Ambulatory Visit: Payer: Self-pay

## 2017-07-06 DIAGNOSIS — L03113 Cellulitis of right upper limb: Secondary | ICD-10-CM

## 2017-07-06 DIAGNOSIS — I10 Essential (primary) hypertension: Secondary | ICD-10-CM

## 2017-07-06 DIAGNOSIS — W540XXA Bitten by dog, initial encounter: Secondary | ICD-10-CM

## 2017-07-06 DIAGNOSIS — S61451A Open bite of right hand, initial encounter: Secondary | ICD-10-CM

## 2017-07-06 MED ORDER — AMOXICILLIN-POT CLAVULANATE 875-125 MG PO TABS: 1.0000 | ORAL_TABLET | Freq: Two times a day (BID) | ORAL | 0 refills | Status: DC

## 2017-07-06 MED ORDER — LIDOCAINE HCL (PF) 2 % IJ SOLN
INTRAMUSCULAR | Status: AC
  Filled 2017-07-06: qty 2

## 2017-07-06 MED ORDER — CEFTRIAXONE SODIUM 250 MG IJ SOLR
INTRAMUSCULAR | Status: AC
  Filled 2017-07-06: qty 500

## 2017-07-06 MED ORDER — TELMISARTAN-HCTZ 40-12.5 MG PO TABS: 1.0000 | ORAL_TABLET | Freq: Every day | ORAL | 1 refills | Status: DC

## 2017-07-06 MED ORDER — CEFTRIAXONE SODIUM 1 G IJ SOLR
500.0000 mg | INTRAMUSCULAR | Status: AC
  Administered 2017-07-06: 500 mg via INTRAMUSCULAR

## 2017-07-06 NOTE — ED Triage Notes (Signed)
Patient reports dog bite to right hand.  Right hand is red, swollen and painful.  Dog bite occurred Tuesday-2 days ago.    Unknown when last tetanus shot was.

## 2017-07-06 NOTE — ED Provider Notes (Signed)
[facility]    CSN: [id number] Arrival date & time: 07/06/17  1705     History   Chief Complaint Chief Complaint  Patient presents with  . Animal Bite    HPI Joseph Ferrell is a 27 y.o. male.  Dog bite 48 hours ago there are puncture wounds and swelling involving the right hand.  Patient unable to fully flex second and third fingers.  By history, dogs shots were up-to-date  HPI  Past Medical History:  Diagnosis Date  . Allergy   . Asthma     Patient Active Problem List   Diagnosis Date Noted  . Routine general medical examination at a health care facility 05/23/2014  . Asthma, intermittent 03/20/2013  . Essential hypertension, benign 03/20/2013  . LVH (left ventricular hypertrophy) 03/20/2013    History reviewed. No pertinent surgical history.     Home Medications    Prior to Admission medications   Medication Sig Start Date End Date Taking? Authorizing Provider  albuterol (PROVENTIL HFA;VENTOLIN HFA) 108 (90 Base) MCG/ACT inhaler Inhale 2 puffs into the lungs every 6 (six) hours as needed for wheezing or shortness of breath. 01/04/16 01/03/17  Etta GrandchildJones, Thomas L, MD  amoxicillin-clavulanate (AUGMENTIN) 875-125 MG tablet Take 1 tablet by mouth 2 (two) times daily. 07/06/17   Frederica KusterMiller, Cortlynn Hollinsworth M, MD  loratadine (CLARITIN) 10 MG tablet Take 1 tablet (10 mg total) by mouth daily. 05/23/14   Etta GrandchildJones, Thomas L, MD  mometasone-formoterol (DULERA) 100-5 MCG/ACT AERO Inhale 2 puffs into the lungs 2 (two) times daily. 01/04/16   Etta GrandchildJones, Thomas L, MD  nebivolol (BYSTOLIC) 5 MG tablet Take 1 tablet (5 mg total) by mouth daily. 01/04/16   Etta GrandchildJones, Thomas L, MD  telmisartan-hydrochlorothiazide (MICARDIS HCT) 40-12.5 MG tablet Take 1 tablet by mouth daily. 07/06/17   Frederica KusterMiller, Mycah Formica M, MD    Family History Family History  Problem Relation Age of Onset  . Hypertension Mother   . Alcohol abuse Father   . Hypertension Father     Social History Social History   Tobacco Use    . Smoking status: Current Some Day Smoker    Packs/day: 1.00    Years: 4.00    Pack years: 4.00    Types: Cigarettes, Cigars  . Smokeless tobacco: Never Used  . Tobacco comment: uses electronic cig with vapor  Substance Use Topics  . Alcohol use: Yes    Alcohol/week: 7.5 oz    Types: 15 Standard drinks or equivalent per week  . Drug use: Yes    Types: Marijuana     Allergies   Patient has no known allergies.   Review of Systems Review of Systems  Constitutional: Negative for chills and fever.  HENT: Negative for ear pain and sore throat.   Eyes: Negative for pain and visual disturbance.  Respiratory: Negative for cough and shortness of breath.   Cardiovascular: Negative for chest pain and palpitations.  Gastrointestinal: Negative for abdominal pain and vomiting.  Genitourinary: Negative for dysuria and hematuria.  Musculoskeletal: Negative for arthralgias and back pain.  Skin: Positive for wound. Negative for color change and rash.  Neurological: Negative for seizures and syncope.  All other systems reviewed and are negative.    Physical Exam Triage Vital Signs ED Triage Vitals  Enc Vitals Group     BP 07/06/17 1729 (!) 151/88     Pulse Rate 07/06/17 1732 74     Resp --      Temp 07/06/17 1732 99.3 F (37.4  C)     Temp Source 07/06/17 1732 Oral     SpO2 07/06/17 1732 100 %     Weight --      Height --      Head Circumference --      Peak Flow --      Pain Score 07/06/17 1725 3     Pain Loc --      Pain Edu? --      Excl. in GC? --    No data found.  Updated Vital Signs BP (!) 151/88 (BP Location: Left Arm)   Pulse 74   Temp 99.3 F (37.4 C) (Oral)   SpO2 100%   Visual Acuity Right Eye Distance:   Left Eye Distance:   Bilateral Distance:    Right Eye Near:   Left Eye Near:    Bilateral Near:     Physical Exam  Constitutional: He appears well-developed and well-nourished.  Skin: Skin is warm. There is erythema.  Right hand there is soft  tissue swelling and cellulitis and decreased range of motion.  There is concern because 2 of the puncture wounds are on the volar aspect of the hand.     UC Treatments / Results  Labs (all labs ordered are listed, but only abnormal results are displayed) Labs Reviewed - No data to display  EKG None Radiology No results found.  Procedures Procedures (including critical care time)  Medications Ordered in UC Medications  cefTRIAXone (ROCEPHIN) injection 500 mg (has no administration in time range)     Initial Impression / Assessment and Plan / UC Course  I have reviewed the triage vital signs and the nursing notes.  Pertinent labs & imaging results that were available during my care of the patient were reviewed by me and considered in my medical decision making (see chart for details).     Cellulitis right hand secondary to bite  Final Clinical Impressions(s) / UC Diagnoses   Final diagnoses:  Dog bite, initial encounter  Cellulitis of right upper extremity  Essential hypertension    ED Discharge Orders        Ordered    amoxicillin-clavulanate (AUGMENTIN) 875-125 MG tablet  2 times daily     07/06/17 1740    telmisartan-hydrochlorothiazide (MICARDIS HCT) 40-12.5 MG tablet  Daily     07/06/17 1743       Controlled Substance Prescriptions [place] Controlled Substance Registry consulted? No   Frederica Kuster, MD 07/06/17 [phone]

## 2017-07-19 ENCOUNTER — Ambulatory Visit (INDEPENDENT_AMBULATORY_CARE_PROVIDER_SITE_OTHER): Payer: No Typology Code available for payment source | Admitting: Internal Medicine

## 2017-07-19 ENCOUNTER — Encounter: Payer: Self-pay | Admitting: Internal Medicine

## 2017-07-19 ENCOUNTER — Ambulatory Visit: Payer: BC Managed Care – PPO | Admitting: Internal Medicine

## 2017-07-19 VITALS — BP 148/88 | HR 77 | Temp 98.5°F | Resp 16 | Ht 69.0 in | Wt 238.5 lb

## 2017-07-19 DIAGNOSIS — Z0000 Encounter for general adult medical examination without abnormal findings: Secondary | ICD-10-CM | POA: Diagnosis not present

## 2017-07-19 DIAGNOSIS — I517 Cardiomegaly: Secondary | ICD-10-CM

## 2017-07-19 DIAGNOSIS — I10 Essential (primary) hypertension: Secondary | ICD-10-CM | POA: Diagnosis not present

## 2017-07-19 DIAGNOSIS — J4520 Mild intermittent asthma, uncomplicated: Secondary | ICD-10-CM | POA: Diagnosis not present

## 2017-07-19 MED ORDER — MOMETASONE FURO-FORMOTEROL FUM 100-5 MCG/ACT IN AERO: 2.0000 | INHALATION_SPRAY | Freq: Two times a day (BID) | RESPIRATORY_TRACT | 11 refills | Status: DC

## 2017-07-19 MED ORDER — ALBUTEROL SULFATE HFA 108 (90 BASE) MCG/ACT IN AERS: 2.0000 | INHALATION_SPRAY | Freq: Four times a day (QID) | RESPIRATORY_TRACT | 11 refills | Status: DC | PRN

## 2017-07-19 MED ORDER — TELMISARTAN-HCTZ 40-12.5 MG PO TABS: 1.0000 | ORAL_TABLET | Freq: Every day | ORAL | 0 refills | Status: DC

## 2017-07-19 NOTE — Patient Instructions (Signed)

## 2017-07-19 NOTE — Progress Notes (Signed)
Subjective:  Patient ID: Joseph Ferrell, male    DOB: Oct 18, 1990  Age: 27 y.o. MRN: [id number]  CC: Annual Exam; Hypertension; and Asthma   HPI Joseph Ferrell presents for a CPX.  He has had a few episodes of nonproductive cough and wheezing recently and wants to restart his Dulera inhaler.  He denies any recent episodes of chest pain, hemoptysis, fever, or chills.  His blood pressure has not been adequately well controlled.  He tells me for the last few months he has not been taking an antihypertensive.  He has had no recent episodes of headache, blurred vision, palpitations, edema, or fatigue.  Outpatient Medications Prior to Visit  Medication Sig Dispense Refill  . amoxicillin-clavulanate (AUGMENTIN) 875-125 MG tablet Take 1 tablet by mouth 2 (two) times daily. 14 tablet 0  . loratadine (CLARITIN) 10 MG tablet Take 1 tablet (10 mg total) by mouth daily. 30 tablet 11  . mometasone-formoterol (DULERA) 100-5 MCG/ACT AERO Inhale 2 puffs into the lungs 2 (two) times daily. 1 Inhaler 11  . nebivolol (BYSTOLIC) 5 MG tablet Take 1 tablet (5 mg total) by mouth daily. 90 tablet 3  . telmisartan-hydrochlorothiazide (MICARDIS HCT) 40-12.5 MG tablet Take 1 tablet by mouth daily. 30 tablet 1  . albuterol (PROVENTIL HFA;VENTOLIN HFA) 108 (90 Base) MCG/ACT inhaler Inhale 2 puffs into the lungs every 6 (six) hours as needed for wheezing or shortness of breath. 1 Inhaler 11   No facility-administered medications prior to visit.     ROS Review of Systems  Constitutional: Negative.  Negative for chills, diaphoresis, fatigue and fever.  HENT: Negative.  Negative for sore throat and trouble swallowing.   Respiratory: Positive for cough and wheezing. Negative for shortness of breath and stridor.   Cardiovascular: Negative for chest pain, palpitations and leg swelling.  Gastrointestinal: Negative for abdominal pain, diarrhea, nausea and vomiting.  Endocrine: Negative.   Genitourinary: Negative for  difficulty urinating, scrotal swelling, testicular pain and urgency.  Musculoskeletal: Negative.  Negative for neck stiffness.  Skin: Negative.   Neurological: Negative.  Negative for dizziness, weakness, light-headedness and numbness.  Hematological: Negative for adenopathy. Does not bruise/bleed easily.  Psychiatric/Behavioral: Negative.  Negative for sleep disturbance. The patient is not nervous/anxious.     Objective:  BP (!) 148/88 (BP Location: Left Arm, Patient Position: Sitting, Cuff Size: Large)   Pulse 77   Temp 98.5 F (36.9 C) (Oral)   Resp 16   Ht  (1.753 m)   Wt 238 lb 8 oz (108.2 kg)   SpO2 97%   BMI 35.22 kg/m   BP Readings from Last 3 Encounters:  07/19/17 (!) 148/88  07/06/17 (!) 151/88  01/04/16 136/80    Wt Readings from Last 3 Encounters:  07/19/17 238 lb 8 oz (108.2 kg)  01/04/16 217 lb (98.4 kg)  12/10/15 206 lb (93.4 kg)    Physical Exam  Constitutional: He is oriented to person, place, and time. No distress.  HENT:  Mouth/Throat: Oropharynx is clear and moist. No oropharyngeal exudate.  Eyes: Conjunctivae are normal. No scleral icterus.  Neck: Normal range of motion. Neck supple. No JVD present. No thyromegaly present.  Cardiovascular: Normal rate, regular rhythm and normal heart sounds. Exam reveals no gallop and no friction rub.  No murmur heard. Pulmonary/Chest: Effort normal and breath sounds normal. No stridor. He has no decreased breath sounds. He has no wheezes. He has no rhonchi. He has no rales.  Abdominal: Soft. Bowel sounds are normal. He  exhibits no mass. There is no tenderness.  Musculoskeletal: Normal range of motion. He exhibits no edema, tenderness or deformity.  Neurological: He is alert and oriented to person, place, and time.  Skin: Skin is warm and dry. He is not diaphoretic.  Psychiatric: He has a normal mood and affect. His behavior is normal. Judgment and thought content normal.  Vitals reviewed.   Lab Results    Component Value Date   WBC 6.2 05/23/2014   HGB 16.0 05/23/2014   HCT 46.5 05/23/2014   PLT 181.0 05/23/2014   GLUCOSE 93 05/23/2014   CHOL 112 05/23/2014   TRIG 58.0 05/23/2014   HDL 53.70 05/23/2014   LDLCALC 47 05/23/2014   ALT 35 05/23/2014   AST 23 05/23/2014   NA 137 05/23/2014   K 4.4 05/23/2014   CL 105 05/23/2014   CREATININE 1.02 05/23/2014   BUN 10 05/23/2014   CO2 29 05/23/2014   TSH 0.75 05/23/2014    No results found.  Assessment & Plan:   Cornel was seen today for annual exam, hypertension and asthma.  Diagnoses and all orders for this visit:  Essential hypertension, benign- His blood pressure is not adequately well controlled.  He will restart the combination of an ARB and thiazide diuretic.  I will check his labs to screen for secondary causes and endorgan damage. -     telmisartan-hydrochlorothiazide (MICARDIS HCT) 40-12.5 MG tablet; Take 1 tablet by mouth daily. -     Comprehensive metabolic panel; Future -     CBC with Differential/Platelet; Future -     Thyroid Panel With TSH; Future -     Urinalysis, Routine w reflex microscopic; Future  LVH (left ventricular hypertrophy)- He has no evidence of fluid overload or heart failure at this time.  I will attempt to get better control of his blood pressure. -     telmisartan-hydrochlorothiazide (MICARDIS HCT) 40-12.5 MG tablet; Take 1 tablet by mouth daily.  Mild intermittent asthma without complication- Will restart the LABA/ICS inhaler.  He was encouraged to use albuterol inhaler as needed. -     albuterol (PROVENTIL HFA;VENTOLIN HFA) 108 (90 Base) MCG/ACT inhaler; Inhale 2 puffs into the lungs every 6 (six) hours as needed for wheezing or shortness of breath. -     mometasone-formoterol (DULERA) 100-5 MCG/ACT AERO; Inhale 2 puffs into the lungs 2 (two) times daily. -     CBC with Differential/Platelet; Future  Routine general medical examination at a health care facility- Exam completed, he refused a flu  vaccine, will update his Tdap, labs ordered, patient education material was given. -     Lipid panel; Future -     HIV antibody; Future  Asthma, intermittent, uncomplicated   I have discontinued Teena Irani. Penaloza's loratadine, nebivolol, and amoxicillin-clavulanate. I am also having him maintain his albuterol, mometasone-formoterol, and telmisartan-hydrochlorothiazide.  Meds ordered this encounter  Medications  . albuterol (PROVENTIL HFA;VENTOLIN HFA) 108 (90 Base) MCG/ACT inhaler    Sig: Inhale 2 puffs into the lungs every 6 (six) hours as needed for wheezing or shortness of breath.    Dispense:  1 Inhaler    Refill:  11  . mometasone-formoterol (DULERA) 100-5 MCG/ACT AERO    Sig: Inhale 2 puffs into the lungs 2 (two) times daily.    Dispense:  1 Inhaler    Refill:  11  . telmisartan-hydrochlorothiazide (MICARDIS HCT) 40-12.5 MG tablet    Sig: Take 1 tablet by mouth daily.    Dispense:  90  tablet    Refill:  0     Follow-up: Return in about 3 months (around 10/19/2017).  Sanda Linger, MD

## 2017-07-20 ENCOUNTER — Telehealth: Payer: Self-pay | Admitting: Internal Medicine

## 2017-07-20 DIAGNOSIS — J4520 Mild intermittent asthma, uncomplicated: Secondary | ICD-10-CM

## 2017-07-20 DIAGNOSIS — I517 Cardiomegaly: Secondary | ICD-10-CM

## 2017-07-20 DIAGNOSIS — I10 Essential (primary) hypertension: Secondary | ICD-10-CM

## 2017-07-20 MED ORDER — ALBUTEROL SULFATE HFA 108 (90 BASE) MCG/ACT IN AERS: 2.0000 | INHALATION_SPRAY | Freq: Four times a day (QID) | RESPIRATORY_TRACT | 11 refills | Status: DC | PRN

## 2017-07-20 MED ORDER — MOMETASONE FURO-FORMOTEROL FUM 100-5 MCG/ACT IN AERO: 2.0000 | INHALATION_SPRAY | Freq: Two times a day (BID) | RESPIRATORY_TRACT | 11 refills | Status: DC

## 2017-07-20 MED ORDER — TELMISARTAN-HCTZ 40-12.5 MG PO TABS: 1.0000 | ORAL_TABLET | Freq: Every day | ORAL | 0 refills | Status: DC

## 2017-07-20 NOTE — Telephone Encounter (Signed)
Resent rx's to correct pharmacy../lmb 

## 2017-07-20 NOTE — Progress Notes (Signed)
Tried to call pt to inform that he needs to come in for his TDAP. Pt vm is on set up.

## 2017-07-20 NOTE — Telephone Encounter (Signed)
Copied from CRM [id number]. Topic: Quick Communication - See Telephone Encounter >> Jul 20, 2017  9:36 AM Jolayne Haines L wrote: CRM for notification. See Telephone encounter for: 07/20/17.  Patient's mom called in for him and stated that the medications that Dr Yetta Barre prescribed him were suppose to go to [facility] on [address] and not [facility]. She wants to know can these be re-submitted.  albuterol (PROVENTIL HFA;VENTOLIN HFA) 108 (90 Base) MCG/ACT inhaler   mometasone-formoterol (DULERA) 100-5 MCG/ACT AERO  telmisartan-hydrochlorothiazide (MICARDIS HCT) 40-12.5 MG tablet

## 2017-08-07 ENCOUNTER — Telehealth: Payer: Self-pay | Admitting: Internal Medicine

## 2017-08-07 ENCOUNTER — Other Ambulatory Visit: Payer: Self-pay | Admitting: Internal Medicine

## 2017-08-07 NOTE — Telephone Encounter (Signed)
Copied from CRM [id number]. Topic: Quick Communication - See Telephone Encounter >> Aug 07, 2017 11:37 AM Lorrine Kin, NT wrote: CRM for notification. See Telephone encounter for: 08/07/17.  Patient's mother, Corrie Dandy, calling and states that the inhaler that was prescribed was $300 at the pharmacy. Would like a different medication in place of this. Would like a new blood pressur medication as well. Cheapest mother could get the blood pressure medication was $43.   CB#: [phone]

## 2017-08-08 ENCOUNTER — Other Ambulatory Visit: Payer: Self-pay | Admitting: Internal Medicine

## 2017-08-08 DIAGNOSIS — J4520 Mild intermittent asthma, uncomplicated: Secondary | ICD-10-CM

## 2017-08-08 MED ORDER — FLUTICASONE-SALMETEROL 100-50 MCG/DOSE IN AEPB: 1.0000 | INHALATION_SPRAY | Freq: Two times a day (BID) | RESPIRATORY_TRACT | 5 refills | Status: DC

## 2017-08-08 NOTE — Telephone Encounter (Signed)
What will his insurance cover for asthma?

## 2017-08-08 NOTE — Telephone Encounter (Signed)
Try advair diskus for asthma (RX sent) I think this is a generic The BP med is already a generic

## 2017-08-08 NOTE — Telephone Encounter (Signed)
Which inhaler is $300? Why doesn't his insurance pay for this? I don't have any good solutions for this

## 2017-08-08 NOTE — Telephone Encounter (Signed)
Called pt spoke w/mother Financial planner) she states the insurance will not cover the dulera at all, cost over $300 and he can not afford, but need something asao for his allergies. The Telmisartan/hctz is tier 2 which copay is $43, and would like a cheaper BP med if possible...Raechel Chute

## 2017-08-08 NOTE — Telephone Encounter (Signed)
Notified pt mom w/MD response../lmb 

## 2017-08-08 NOTE — Telephone Encounter (Signed)
Called insurance per rep his plan will only cover generic medications only. She did not give a name of an alternative for the dulera. She states the pt have a max of $100 a month for his medication, and after that he is responsible for the amount.Marland KitchenRaechel Chute

## 2017-08-15 ENCOUNTER — Ambulatory Visit: Payer: Self-pay | Admitting: Internal Medicine

## 2017-08-15 NOTE — Telephone Encounter (Signed)
Pt's mom called in to verify if fax was received in regards to pt's medications from insurance per Valentina Gu and pt's mom conversation?   Mom says that she would also like for the provider to look into pt's bp medication also, mom is comparing her BP medication cost to pt's and says that her medication is less expensive, she would like to know if provider could look into something less expensive for the pt.    CB: [phone]

## 2017-08-15 NOTE — Telephone Encounter (Signed)
Step.. Have you seen a PA on pt Advair...Raechel Chute

## 2017-08-15 NOTE — Telephone Encounter (Signed)
I have not

## 2017-08-17 ENCOUNTER — Other Ambulatory Visit: Payer: Self-pay | Admitting: Internal Medicine

## 2017-08-17 DIAGNOSIS — I10 Essential (primary) hypertension: Secondary | ICD-10-CM

## 2017-08-17 DIAGNOSIS — I517 Cardiomegaly: Secondary | ICD-10-CM

## 2017-08-17 MED ORDER — HYDROCHLOROTHIAZIDE 25 MG PO TABS: 25.0000 mg | ORAL_TABLET | Freq: Every day | ORAL | 1 refills | Status: DC

## 2017-08-17 MED ORDER — TELMISARTAN 40 MG PO TABS: 40.0000 mg | ORAL_TABLET | Freq: Every day | ORAL | 1 refills | Status: DC

## 2017-08-17 NOTE — Telephone Encounter (Addendum)
Pt mother called. We looked at the formulary for pt together.   Can you send in the telmisartan and hydrochlorothiazide separately?  Please advise.

## 2017-08-17 NOTE — Telephone Encounter (Signed)
done

## 2017-08-18 NOTE — Telephone Encounter (Signed)
Mom called back and stated that there is no deductible that is needing to be met. Mom is resending the formulary for our review.

## 2017-08-21 ENCOUNTER — Other Ambulatory Visit: Payer: Self-pay | Admitting: Internal Medicine

## 2017-08-21 DIAGNOSIS — J4520 Mild intermittent asthma, uncomplicated: Secondary | ICD-10-CM

## 2017-08-21 NOTE — Telephone Encounter (Signed)
Is there an alternative to the Advair? 1 month supply cost $200+.   Please advise.

## 2017-08-22 MED ORDER — FLUTICASONE-SALMETEROL 100-50 MCG/DOSE IN AEPB: 1.0000 | INHALATION_SPRAY | Freq: Two times a day (BID) | RESPIRATORY_TRACT | 5 refills | Status: DC

## 2017-08-22 MED ORDER — FLUTICASONE-SALMETEROL 100-50 MCG/DOSE IN AEPB
1.0000 | INHALATION_SPRAY | Freq: Two times a day (BID) | RESPIRATORY_TRACT | 5 refills | Status: DC
Start: 2017-08-22 — End: 2017-08-22

## 2017-08-22 NOTE — Telephone Encounter (Signed)
None of these are appropriate alternatives.  None of these have a steroid and none of these have a long acting beta agonist.

## 2017-08-22 NOTE — Telephone Encounter (Signed)
Pt mother Joseph Ferrell([place] - CB#: [phone]  Contacted pharmacy and they were able to run the Good Rx card and the rx has been reduced to 108.14.  I also found some patient assistance for the Advair and mother is picking up the form. Joseph DandyMary will bring the form completed back to us so we can complete the provider portion.

## 2017-08-22 NOTE — Telephone Encounter (Signed)
Reviewed the formulary and there is one alternative for the albuterol = Proair and there is one alternative for the Advair = Atrovent.   *side note - there is neb solution that is covered = ipratropium neb; if you feel that is more appropriate.   Please advise.

## 2018-06-14 ENCOUNTER — Other Ambulatory Visit: Payer: Self-pay | Admitting: Internal Medicine

## 2018-06-14 DIAGNOSIS — I517 Cardiomegaly: Secondary | ICD-10-CM

## 2018-06-14 DIAGNOSIS — I10 Essential (primary) hypertension: Secondary | ICD-10-CM

## 2018-11-27 ENCOUNTER — Ambulatory Visit (INDEPENDENT_AMBULATORY_CARE_PROVIDER_SITE_OTHER): Payer: No Typology Code available for payment source | Admitting: Internal Medicine

## 2018-11-27 ENCOUNTER — Other Ambulatory Visit: Payer: Self-pay

## 2018-11-27 ENCOUNTER — Other Ambulatory Visit: Payer: Self-pay | Admitting: Internal Medicine

## 2018-11-27 ENCOUNTER — Encounter: Payer: Self-pay | Admitting: Internal Medicine

## 2018-11-27 VITALS — BP 150/82 | HR 96 | Temp 99.1°F | Resp 16 | Ht 70.8 in | Wt 228.0 lb

## 2018-11-27 DIAGNOSIS — J4520 Mild intermittent asthma, uncomplicated: Secondary | ICD-10-CM

## 2018-11-27 DIAGNOSIS — J4541 Moderate persistent asthma with (acute) exacerbation: Secondary | ICD-10-CM | POA: Insufficient documentation

## 2018-11-27 DIAGNOSIS — Z23 Encounter for immunization: Secondary | ICD-10-CM

## 2018-11-27 DIAGNOSIS — J988 Other specified respiratory disorders: Secondary | ICD-10-CM | POA: Insufficient documentation

## 2018-11-27 MED ORDER — DULERA 200-5 MCG/ACT IN AERO: 2.0000 | INHALATION_SPRAY | Freq: Two times a day (BID) | RESPIRATORY_TRACT | 1 refills | Status: DC

## 2018-11-27 MED ORDER — AZITHROMYCIN 500 MG PO TABS: 500.0000 mg | ORAL_TABLET | Freq: Every day | ORAL | 0 refills | Status: AC

## 2018-11-27 MED ORDER — METHYLPREDNISOLONE ACETATE 80 MG/ML IJ SUSP
120.0000 mg | Freq: Once | INTRAMUSCULAR | Status: AC
  Administered 2018-11-27: 120 mg via INTRAMUSCULAR

## 2018-11-27 NOTE — Progress Notes (Signed)
Subjective:  Patient ID: Joseph Ferrell, male    DOB: 03/21/1991  Age: 28 y.o. MRN: [id number]  CC: Cough and Asthma   HPI Joseph Ferrell presents for symptoms about a 2-day history of cough productive of yellow phlegm with low-grade fever and night sweats but no chills.  He also complains of wheezing and shortness of breath.  He is not currently using a LABA/ICS inhaler because he ran out.  He has gotten some symptom relief with an albuterol inhaler.  Outpatient Medications Prior to Visit  Medication Sig Dispense Refill  . Fluticasone-Salmeterol (ADVAIR) 100-50 MCG/DOSE AEPB Inhale 1 puff into the lungs 2 (two) times daily. 1 each 5  . hydrochlorothiazide (HYDRODIURIL) 25 MG tablet Take 1 tablet by mouth daily 90 tablet 0  . telmisartan (MICARDIS) 40 MG tablet Take 1 tablet (40 mg total) by mouth daily. 90 tablet 1  . albuterol (VENTOLIN HFA) 108 (90 Base) MCG/ACT inhaler INHALE 2 PUFFS BY MOUTH EVERY 6 HOURS AS NEEDED FOR WHEEZING OR SHORTNESS OF BREATH 18 g 0   No facility-administered medications prior to visit.     ROS Review of Systems  Constitutional: Positive for fever. Negative for chills, diaphoresis and fatigue.  HENT: Negative.  Negative for congestion, sore throat and trouble swallowing.   Eyes: Negative.   Respiratory: Positive for cough, shortness of breath and wheezing.   Cardiovascular: Negative for chest pain, palpitations and leg swelling.  Gastrointestinal: Negative for abdominal pain, diarrhea and nausea.  Endocrine: Negative.   Genitourinary: Negative.  Negative for difficulty urinating.  Musculoskeletal: Negative.  Negative for arthralgias and myalgias.  Skin: Negative for color change and rash.  Neurological: Negative.  Negative for dizziness, weakness, light-headedness and headaches.  Hematological: Negative for adenopathy. Does not bruise/bleed easily.  Psychiatric/Behavioral: Negative.     Objective:  BP (!) 150/82 (BP Location: Left Arm, Patient  Position: Sitting, Cuff Size: Normal)   Pulse 96   Temp 99.1 F (37.3 C) (Tympanic)   Resp 16   Ht 5' 10.8" (1.798 m)   Wt 228 lb (103.4 kg)   SpO2 97%   BMI 31.98 kg/m   BP Readings from Last 3 Encounters:  11/27/18 (!) 150/82  07/19/17 (!) 148/88  07/06/17 (!) 151/88    Wt Readings from Last 3 Encounters:  11/27/18 228 lb (103.4 kg)  07/19/17 238 lb 8 oz (108.2 kg)  01/04/16 217 lb (98.4 kg)    Physical Exam Vitals signs reviewed.  Constitutional:      General: He is not in acute distress.    Appearance: He is obese. He is not ill-appearing, toxic-appearing or diaphoretic.  HENT:     Nose: Nose normal.     Mouth/Throat:     Mouth: Mucous membranes are moist.  Eyes:     General: No scleral icterus.    Conjunctiva/sclera: Conjunctivae normal.  Neck:     Musculoskeletal: Normal range of motion. No muscular tenderness.  Cardiovascular:     Rate and Rhythm: Normal rate and regular rhythm.     Heart sounds: No murmur.  Pulmonary:     Effort: Pulmonary effort is normal. No tachypnea, accessory muscle usage, respiratory distress or retractions.     Breath sounds: No stridor. Examination of the right-upper field reveals wheezing. Examination of the left-upper field reveals wheezing. Examination of the right-middle field reveals wheezing. Examination of the left-middle field reveals wheezing. Examination of the right-lower field reveals wheezing. Examination of the left-lower field reveals wheezing. Wheezing present. No  decreased breath sounds, rhonchi or rales.     Comments: There are inspiratory and expiratory wheezes but there is good air movement Abdominal:     General: Abdomen is protuberant. Bowel sounds are normal. There is no distension.     Palpations: There is no hepatomegaly or splenomegaly.     Tenderness: There is no abdominal tenderness.  Musculoskeletal: Normal range of motion.     Right lower leg: No edema.     Left lower leg: No edema.  Lymphadenopathy:      Cervical: No cervical adenopathy.  Skin:    General: Skin is warm and dry.  Neurological:     General: No focal deficit present.     Mental Status: He is alert. Mental status is at baseline.     Lab Results  Component Value Date   WBC 6.2 05/23/2014   HGB 16.0 05/23/2014   HCT 46.5 05/23/2014   PLT 181.0 05/23/2014   GLUCOSE 93 05/23/2014   CHOL 112 05/23/2014   TRIG 58.0 05/23/2014   HDL 53.70 05/23/2014   LDLCALC 47 05/23/2014   ALT 35 05/23/2014   AST 23 05/23/2014   NA 137 05/23/2014   K 4.4 05/23/2014   CL 105 05/23/2014   CREATININE 1.02 05/23/2014   BUN 10 05/23/2014   CO2 29 05/23/2014   TSH 0.75 05/23/2014    No results found.  Assessment & Plan:   Joseph Ferrell was seen today for cough and asthma.  Diagnoses and all orders for this visit:  Need for influenza vaccination -     Flu Vaccine QUAD 36+ mos IM  Need for Tdap vaccination -     Tdap vaccine greater than or equal to 7yo IM  RTI (respiratory tract infection)- I will treat him for typical and atypical organisms with azithromycin. -     azithromycin (ZITHROMAX) 500 MG tablet; Take 1 tablet (500 mg total) by mouth daily for 3 days.  Moderate persistent asthma with acute exacerbation- He is having an acute exacerbation so I gave him an injection of methylprednisolone.  I have also asked him to restart a LABA/ICS inhaler.  I recommended he use the albuterol inhaler as needed. -     mometasone-formoterol (DULERA) 200-5 MCG/ACT AERO; Inhale 2 puffs into the lungs 2 (two) times daily. -     methylPREDNISolone acetate (DEPO-MEDROL) injection 120 mg  Mild intermittent asthma without complication -     mometasone-formoterol (DULERA) 200-5 MCG/ACT AERO; Inhale 2 puffs into the lungs 2 (two) times daily. -     albuterol (VENTOLIN HFA) 108 (90 Base) MCG/ACT inhaler; INHALE 2 PUFFS BY MOUTH EVERY 6 HOURS AS NEEDED FOR WHEEZING OR SHORTNESS OF BREATH   I am having Joseph Ferrell start on azithromycin and Dulera. I  am also having him maintain his telmisartan, Fluticasone-Salmeterol, hydrochlorothiazide, and albuterol. We administered methylPREDNISolone acetate.  Meds ordered this encounter  Medications  . azithromycin (ZITHROMAX) 500 MG tablet    Sig: Take 1 tablet (500 mg total) by mouth daily for 3 days.    Dispense:  3 tablet    Refill:  0  . mometasone-formoterol (DULERA) 200-5 MCG/ACT AERO    Sig: Inhale 2 puffs into the lungs 2 (two) times daily.    Dispense:  39 g    Refill:  1  . methylPREDNISolone acetate (DEPO-MEDROL) injection 120 mg  . albuterol (VENTOLIN HFA) 108 (90 Base) MCG/ACT inhaler    Sig: INHALE 2 PUFFS BY MOUTH EVERY 6 HOURS AS NEEDED  FOR WHEEZING OR SHORTNESS OF BREATH    Dispense:  18 g    Refill:  3     Follow-up: Return in about 3 weeks (around 12/18/2018).  Sanda Linger, MD

## 2018-11-27 NOTE — Patient Instructions (Signed)
Asthma, Adult  Asthma is a long-term (chronic) condition that causes recurrent episodes in which the airways become tight and narrow. The airways are the passages that lead from the nose and mouth down into the lungs. Asthma episodes, also called asthma attacks, can cause coughing, wheezing, shortness of breath, and chest pain. The airways can also fill with mucus. During an attack, it can be difficult to breathe. Asthma attacks can range from minor to life threatening. Asthma cannot be cured, but medicines and lifestyle changes can help control it and treat acute attacks. What are the causes? This condition is believed to be caused by inherited (genetic) and environmental factors, but its exact cause is not known. There are many things that can bring on an asthma attack or make asthma symptoms worse (triggers). Asthma triggers are different for each person. Common triggers include:  Mold.  Dust.  Cigarette smoke.  Cockroaches.  Things that can cause allergy symptoms (allergens), such as animal dander or pollen from trees or grass.  Air pollutants such as household cleaners, wood smoke, smog, or chemical odors.  Cold air, weather changes, and winds (which increase molds and pollen in the air).  Strong emotional expressions such as crying or laughing hard.  Stress.  Certain medicines (such as aspirin) or types of medicines (such as beta-blockers).  Sulfites in foods and drinks. Foods and drinks that may contain sulfites include dried fruit, potato chips, and sparkling grape juice.  Infections or inflammatory conditions such as the flu, a cold, or inflammation of the nasal membranes (rhinitis).  Gastroesophageal reflux disease (GERD).  Exercise or strenuous activity. What are the signs or symptoms? Symptoms of this condition may occur right after asthma is triggered or many hours later. Symptoms include:  Wheezing. This can sound like whistling when you breathe.  Excessive  nighttime or early morning coughing.  Frequent or severe coughing with a common cold.  Chest tightness.  Shortness of breath.  Tiredness (fatigue) with minimal activity. How is this diagnosed? This condition is diagnosed based on:  Your medical history.  A physical exam.  Tests, which may include: ? Lung function studies and pulmonary studies (spirometry). These tests can evaluate the flow of air in your lungs. ? Allergy tests. ? Imaging tests, such as X-rays. How is this treated? There is no cure for this condition, but treatment can help control your symptoms. Treatment for asthma usually involves:  Identifying and avoiding your asthma triggers.  Using medicines to control your symptoms. Generally, two types of medicines are used to treat asthma: ? Controller medicines. These help prevent asthma symptoms from occurring. They are usually taken every day. ? Fast-acting reliever or rescue medicines. These quickly relieve asthma symptoms by widening the narrow and tight airways. They are used as needed and provide short-term relief.  Using supplemental oxygen. This may be needed during a severe episode.  Using other medicines, such as: ? Allergy medicines, such as antihistamines, if your asthma attacks are triggered by allergens. ? Immune medicines (immunomodulators). These are medicines that help control the immune system.  Creating an asthma action plan. An asthma action plan is a written plan for managing and treating your asthma attacks. This plan includes: ? A list of your asthma triggers and how to avoid them. ? Information about when medicines should be taken and when their dosage should be changed. ? Instructions about using a device called a peak flow meter. A peak flow meter measures how well the lungs are working and the   severity of your asthma. It helps you monitor your condition. Follow these instructions at home: Controlling your home environment Control your home  environment in the following ways to help avoid triggers and prevent asthma attacks:  Change your heating and air conditioning filter regularly.  Limit your use of fireplaces and wood stoves.  Get rid of pests (such as roaches and mice) and their droppings.  Throw away plants if you see mold on them.  Clean floors and dust surfaces regularly. Use unscented cleaning products.  Try to have someone else vacuum for you regularly. Stay out of rooms while they are being vacuumed and for a short while afterward. If you vacuum, use a dust mask from a hardware store, a double-layered or microfilter vacuum cleaner bag, or a vacuum cleaner with a HEPA filter.  Replace carpet with wood, tile, or vinyl flooring. Carpet can trap dander and dust.  Use allergy-proof pillows, mattress covers, and box spring covers.  Keep your bedroom a trigger-free room.  Avoid pets and keep windows closed when allergens are in the air.  Wash beddings every week in hot water and dry them in a dryer.  Use blankets that are made of polyester or cotton.  Clean bathrooms and kitchens with bleach. If possible, have someone repaint the walls in these rooms with mold-resistant paint. Stay out of the rooms that are being cleaned and painted.  Wash your hands often with soap and water. If soap and water are not available, use hand sanitizer.  Do not allow anyone to smoke in your home. General instructions  Take over-the-counter and prescription medicines only as told by your health care provider. ? Speak with your health care provider if you have questions about how or when to take the medicines. ? Make note if you are requiring more frequent dosages.  Do not use any products that contain nicotine or tobacco, such as cigarettes and e-cigarettes. If you need help quitting, ask your health care provider. Also, avoid being exposed to secondhand smoke.  Use a peak flow meter as told by your health care provider. Record and  keep track of the readings.  Understand and use the asthma action plan to help minimize, or stop an asthma attack, without needing to seek medical care.  Make sure you stay up to date on your yearly vaccinations as told by your health care provider. This may include vaccines for the flu and pneumonia.  Avoid outdoor activities when allergen counts are high and when air quality is low.  Wear a ski mask that covers your nose and mouth during outdoor winter activities. Exercise indoors on cold days if you can.  Warm up before exercising, and take time for a cool-down period after exercise.  Keep all follow-up visits as told by your health care provider. This is important. Where to find more information  For information about asthma, turn to the Centers for Disease Control and Prevention at www.cdc.gov/asthma/faqs.htm  For air quality information, turn to AirNow at https://airnow.gov/ Contact a health care provider if:  You have wheezing, shortness of breath, or a cough even while you are taking medicine to prevent attacks.  The mucus you cough up (sputum) is thicker than usual.  Your sputum changes from clear or white to yellow, green, gray, or bloody.  Your medicines are causing side effects, such as a rash, itching, swelling, or trouble breathing.  You need to use a reliever medicine more than 2-3 times a week.  Your peak flow reading   is still at 50-79% of your personal best after following your action plan for 1 hour.  You have a fever. Get help right away if:  You are getting worse and do not respond to treatment during an asthma attack.  You are short of breath when at rest or when doing very little physical activity.  You have difficulty eating, drinking, or talking.  You have chest pain or tightness.  You develop a fast heartbeat or palpitations.  You have a bluish color to your lips or fingernails.  You are light-headed or dizzy, or you faint.  Your peak flow  reading is less than 50% of your personal best.  You feel too tired to breathe normally. Summary  Asthma is a long-term (chronic) condition that causes recurrent episodes in which the airways become tight and narrow. These episodes can cause coughing, wheezing, shortness of breath, and chest pain.  Asthma cannot be cured, but medicines and lifestyle changes can help control it and treat acute attacks.  Make sure you understand how to avoid triggers and how and when to use your medicines.  Asthma attacks can range from minor to life threatening. Get help right away if you have an asthma attack and do not respond to treatment with your usual rescue medicines. This information is not intended to replace advice given to you by your health care provider. Make sure you discuss any questions you have with your health care provider. Document Released: 03/07/2005 Document Revised: 05/10/2018 Document Reviewed: 04/11/2016 Elsevier Patient Education  2020 [employer]  

## 2018-11-28 MED ORDER — ALBUTEROL SULFATE HFA 108 (90 BASE) MCG/ACT IN AERS: INHALATION_SPRAY | RESPIRATORY_TRACT | 3 refills | Status: DC

## 2018-11-29 ENCOUNTER — Telehealth: Payer: Self-pay | Admitting: Internal Medicine

## 2018-11-29 NOTE — Telephone Encounter (Signed)
Pt's mother called in to ask provider if he is able to give them more samples of mometasone-formoterol (DULERA) 200-5 MCG/ACT AERO? She said that the Rx is 340.00 for a month, which is a little to expensive for them. She would like to also know if there is another medication that would work the same but is less expensive? If not, samples will help.    Please advise.  Joseph Ferrell

## 2018-12-03 ENCOUNTER — Other Ambulatory Visit: Payer: Self-pay | Admitting: Internal Medicine

## 2018-12-03 DIAGNOSIS — I10 Essential (primary) hypertension: Secondary | ICD-10-CM

## 2018-12-03 DIAGNOSIS — I517 Cardiomegaly: Secondary | ICD-10-CM

## 2018-12-03 DIAGNOSIS — J4520 Mild intermittent asthma, uncomplicated: Secondary | ICD-10-CM

## 2018-12-03 MED ORDER — HYDROCHLOROTHIAZIDE 25 MG PO TABS: 25.0000 mg | ORAL_TABLET | Freq: Every day | ORAL | 0 refills | Status: DC

## 2018-12-03 MED ORDER — FLUTICASONE-SALMETEROL 250-50 MCG/DOSE IN AEPB: 1.0000 | INHALATION_SPRAY | Freq: Two times a day (BID) | RESPIRATORY_TRACT | 5 refills | Status: DC

## 2018-12-03 NOTE — Telephone Encounter (Signed)
Is there an alternative for the dulera?   Refill on BP medications also, please.   Pt does not have rx insurance and did not qualify for patient assistance. Also tried [facility] and there price is also expensive.

## 2018-12-18 ENCOUNTER — Ambulatory Visit (INDEPENDENT_AMBULATORY_CARE_PROVIDER_SITE_OTHER): Payer: Self-pay | Admitting: Internal Medicine

## 2018-12-18 ENCOUNTER — Encounter: Payer: Self-pay | Admitting: Internal Medicine

## 2018-12-18 ENCOUNTER — Other Ambulatory Visit: Payer: Self-pay

## 2018-12-18 VITALS — BP 138/86 | HR 88 | Temp 98.1°F | Resp 16 | Ht 70.8 in | Wt 227.0 lb

## 2018-12-18 DIAGNOSIS — J4520 Mild intermittent asthma, uncomplicated: Secondary | ICD-10-CM

## 2018-12-18 DIAGNOSIS — I10 Essential (primary) hypertension: Secondary | ICD-10-CM

## 2018-12-18 NOTE — Progress Notes (Signed)
Subjective:  Patient ID: Joseph Ferrell, male    DOB: [birth year]  Age: 28 y.o. MRN: [id number]  CC: Hypertension and Asthma   HPI SKANDA WORLDS presents for f/up - He is using the LABA/ICS inhaler and tells me his asthma symptoms have resolved.  He feels well today and offers no complaints.  Outpatient Medications Prior to Visit  Medication Sig Dispense Refill  . albuterol (VENTOLIN HFA) 108 (90 Base) MCG/ACT inhaler INHALE 2 PUFFS BY MOUTH EVERY 6 HOURS AS NEEDED FOR WHEEZING OR SHORTNESS OF BREATH 18 g 3  . Fluticasone-Salmeterol (WIXELA INHUB) 250-50 MCG/DOSE AEPB Inhale 1 puff into the lungs 2 (two) times daily. 60 each 5  . hydrochlorothiazide (HYDRODIURIL) 25 MG tablet Take 1 tablet (25 mg total) by mouth daily. 90 tablet 0   No facility-administered medications prior to visit.     ROS Review of Systems  Constitutional: Negative for chills, diaphoresis, fatigue and fever.  HENT: Negative.   Eyes: Negative for visual disturbance.  Respiratory: Negative for cough, chest tightness, shortness of breath and wheezing.   Cardiovascular: Negative for palpitations and leg swelling.  Gastrointestinal: Negative for abdominal pain, diarrhea, nausea and vomiting.  Endocrine: Negative.   Genitourinary: Negative.  Negative for difficulty urinating.  Musculoskeletal: Negative for arthralgias and myalgias.  Skin: Negative.  Negative for color change and pallor.  Neurological: Negative.  Negative for dizziness and light-headedness.  Hematological: Negative for adenopathy. Does not bruise/bleed easily.  Psychiatric/Behavioral: Negative.     Objective:  BP 138/86 (BP Location: Left Arm, Patient Position: Sitting, Cuff Size: Large)   Pulse 88   Temp 98.1 F (36.7 C) (Oral)   Resp 16   Ht 5' 10.8" (1.798 m)   Wt 227 lb (103 kg)   SpO2 98%   BMI 31.84 kg/m   BP Readings from Last 3 Encounters:  12/18/18 138/86  11/27/18 (!) 150/82  07/19/17 (!) 148/88    Wt Readings from Last  3 Encounters:  12/18/18 227 lb (103 kg)  11/27/18 228 lb (103.4 kg)  07/19/17 238 lb 8 oz (108.2 kg)    Physical Exam Vitals signs reviewed.  Constitutional:      Appearance: He is obese. He is not ill-appearing or diaphoretic.  HENT:     Nose: Nose normal.     Mouth/Throat:     Mouth: Mucous membranes are moist.     Pharynx: No oropharyngeal exudate.  Eyes:     General: No scleral icterus.    Conjunctiva/sclera: Conjunctivae normal.  Neck:     Musculoskeletal: Normal range of motion and neck supple.  Cardiovascular:     Rate and Rhythm: Normal rate and regular rhythm.     Heart sounds: No murmur.  Pulmonary:     Effort: Pulmonary effort is normal.     Breath sounds: No stridor. No wheezing, rhonchi or rales.  Abdominal:     General: Abdomen is flat. Bowel sounds are normal. There is no distension.     Palpations: Abdomen is soft. There is no hepatomegaly or splenomegaly.     Tenderness: There is no abdominal tenderness.  Musculoskeletal: Normal range of motion.     Right lower leg: No edema.     Left lower leg: No edema.  Lymphadenopathy:     Cervical: No cervical adenopathy.  Skin:    General: Skin is warm and dry.  Neurological:     General: No focal deficit present.     Mental Status: He is alert.  Psychiatric:        Mood and Affect: Mood normal.        Behavior: Behavior normal.     Lab Results  Component Value Date   WBC 6.2 05/23/2014   HGB 16.0 05/23/2014   HCT 46.5 05/23/2014   PLT 181.0 05/23/2014   GLUCOSE 93 05/23/2014   CHOL 112 05/23/2014   TRIG 58.0 05/23/2014   HDL 53.70 05/23/2014   LDLCALC 47 05/23/2014   ALT 35 05/23/2014   AST 23 05/23/2014   NA 137 05/23/2014   K 4.4 05/23/2014   CL 105 05/23/2014   CREATININE 1.02 05/23/2014   BUN 10 05/23/2014   CO2 29 05/23/2014   TSH 0.75 05/23/2014    No results found.  Assessment & Plan:   Spence was seen today for hypertension and asthma.  Diagnoses and all orders for this visit:   Essential hypertension, benign- His blood pressure is well controlled.  I will monitor his electrolytes and renal function. -     Basic metabolic panel; Future  Mild intermittent asthma without complication- His symptoms are well controlled with the LABA/ICS inhaler.  Will continue.   I am having Flonnie Hailstone maintain his albuterol, hydrochlorothiazide, and Fluticasone-Salmeterol.  No orders of the defined types were placed in this encounter.    Follow-up: No follow-ups on file.  Sanda Linger, MD

## 2018-12-19 ENCOUNTER — Encounter: Payer: Self-pay | Admitting: Internal Medicine

## 2018-12-19 NOTE — Patient Instructions (Signed)

## 2019-05-22 ENCOUNTER — Telehealth: Payer: Self-pay

## 2019-05-22 NOTE — Telephone Encounter (Signed)
Contacted Joseph Ferrell (pt mother) and informed that pt can use an OTC allergy medication. Mary requested an rx but pt does not have insurance and I informed her that it would more expensive to go that route.   Joseph Ferrell stated understanding and will have him try cetirizine OTC.

## 2019-05-22 NOTE — Telephone Encounter (Signed)
New message    The mother calling asking can Dr. Yetta Barre prescribe allergies medication - no wheezing having some headache.     S/p Asthma  Last office visit  9.29.20   [facility] on [address]

## 2020-12-28 ENCOUNTER — Encounter: Payer: Self-pay | Admitting: Internal Medicine

## 2020-12-28 ENCOUNTER — Ambulatory Visit (INDEPENDENT_AMBULATORY_CARE_PROVIDER_SITE_OTHER): Payer: Self-pay | Admitting: Internal Medicine

## 2020-12-28 ENCOUNTER — Other Ambulatory Visit: Payer: Self-pay

## 2020-12-28 VITALS — BP 146/104 | HR 88 | Temp 98.3°F | Resp 16 | Ht 70.0 in | Wt 226.0 lb

## 2020-12-28 DIAGNOSIS — I10 Essential (primary) hypertension: Secondary | ICD-10-CM

## 2020-12-28 DIAGNOSIS — Z0000 Encounter for general adult medical examination without abnormal findings: Secondary | ICD-10-CM

## 2020-12-28 DIAGNOSIS — I517 Cardiomegaly: Secondary | ICD-10-CM

## 2020-12-28 DIAGNOSIS — J4520 Mild intermittent asthma, uncomplicated: Secondary | ICD-10-CM

## 2020-12-28 LAB — BASIC METABOLIC PANEL
BUN: 12 mg/dL (ref 6–23)
CO2: 26 mEq/L (ref 19–32)
Calcium: 9.6 mg/dL (ref 8.4–10.5)
Chloride: 104 mEq/L (ref 96–112)
Creatinine, Ser: 0.98 mg/dL (ref 0.40–1.50)
GFR: 104.93 mL/min (ref >60.00)
Glucose, Bld: 82 mg/dL (ref 70–99)
Potassium: 3.9 mEq/L (ref 3.5–5.1)
Sodium: 138 mEq/L (ref 135–145)

## 2020-12-28 LAB — URINALYSIS, ROUTINE W REFLEX MICROSCOPIC
Bilirubin Urine: NEGATIVE
Hgb urine dipstick: NEGATIVE
Ketones, ur: NEGATIVE
Leukocytes,Ua: NEGATIVE
Nitrite: NEGATIVE
RBC / HPF: NONE SEEN (ref 0–?)
Specific Gravity, Urine: >=1.03 — AB (ref 1.000–1.030)
Total Protein, Urine: NEGATIVE
Urine Glucose: NEGATIVE
Urobilinogen, UA: 0.2 (ref 0.0–1.0)
pH: 6 (ref 5.0–8.0)

## 2020-12-28 LAB — CBC WITH DIFFERENTIAL/PLATELET
Basophils Absolute: 0 10*3/uL (ref 0.0–0.1)
Basophils Relative: 0.3 % (ref 0.0–3.0)
Eosinophils Absolute: 0.1 10*3/uL (ref 0.0–0.7)
Eosinophils Relative: 2.4 % (ref 0.0–5.0)
HCT: 45.7 % (ref 39.0–52.0)
Hemoglobin: 15.4 g/dL (ref 13.0–17.0)
Lymphocytes Relative: 43.6 % (ref 12.0–46.0)
Lymphs Abs: 2.1 10*3/uL (ref 0.7–4.0)
MCHC: 33.7 g/dL (ref 30.0–36.0)
MCV: 95.8 fl (ref 78.0–100.0)
Monocytes Absolute: 0.5 10*3/uL (ref 0.1–1.0)
Monocytes Relative: 10.7 % (ref 3.0–12.0)
Neutro Abs: 2.1 10*3/uL (ref 1.4–7.7)
Neutrophils Relative %: 43 % (ref 43.0–77.0)
Platelets: 190 10*3/uL (ref 150.0–400.0)
RBC: 4.77 Mil/uL (ref 4.22–5.81)
RDW: 12.8 % (ref 11.5–15.5)
WBC: 4.9 10*3/uL (ref 4.0–10.5)

## 2020-12-28 LAB — HEPATIC FUNCTION PANEL
ALT: 20 U/L (ref 0–53)
AST: 22 U/L (ref 0–37)
Albumin: 4.5 g/dL (ref 3.5–5.2)
Alkaline Phosphatase: 49 U/L (ref 39–117)
Bilirubin, Direct: 0.1 mg/dL (ref 0.0–0.3)
Total Bilirubin: 0.7 mg/dL (ref 0.2–1.2)
Total Protein: 7.8 g/dL (ref 6.0–8.3)

## 2020-12-28 LAB — TSH: TSH: 1.14 u[IU]/mL (ref 0.35–5.50)

## 2020-12-28 MED ORDER — ALBUTEROL SULFATE HFA 108 (90 BASE) MCG/ACT IN AERS: INHALATION_SPRAY | RESPIRATORY_TRACT | 3 refills | Status: DC

## 2020-12-28 MED ORDER — FLUTICASONE-SALMETEROL 100-50 MCG/ACT IN AEPB: 1.0000 | INHALATION_SPRAY | Freq: Two times a day (BID) | RESPIRATORY_TRACT | 1 refills | Status: DC

## 2020-12-28 MED ORDER — HYDROCHLOROTHIAZIDE 25 MG PO TABS: 25.0000 mg | ORAL_TABLET | Freq: Every day | ORAL | 0 refills | Status: DC

## 2020-12-28 NOTE — Progress Notes (Signed)
Subjective:  Patient ID: Joseph Ferrell, male    DOB: [birth year]  Age: 30 y.o. MRN: [id number]  CC: Annual Exam, Hypertension, and Asthma  This visit occurred during the SARS-CoV-2 public health emergency.  Safety protocols were in place, including screening questions prior to the visit, additional usage of staff PPE, and extensive cleaning of exam room while observing appropriate contact time as indicated for disinfecting solutions.    HPI Joseph Ferrell presents for a CPX and f/up -  Joseph Ferrell has a history of hypertension and LVH.  Joseph Ferrell is not currently taking an antihypertensive.  Joseph Ferrell denies headache, blurred vision, chest pain, shortness of breath, diaphoresis, or edema.  Joseph Ferrell has rare episodes of wheezing.  Outpatient Medications Prior to Visit  Medication Sig Dispense Refill   albuterol (VENTOLIN HFA) 108 (90 Base) MCG/ACT inhaler INHALE 2 PUFFS BY MOUTH EVERY 6 HOURS AS NEEDED FOR WHEEZING OR SHORTNESS OF BREATH 18 g 3   Fluticasone-Salmeterol (WIXELA INHUB) 250-50 MCG/DOSE AEPB Inhale 1 puff into the lungs 2 (two) times daily. 60 each 5   hydrochlorothiazide (HYDRODIURIL) 25 MG tablet Take 1 tablet (25 mg total) by mouth daily. 90 tablet 0   No facility-administered medications prior to visit.    ROS Review of Systems  Constitutional:  Negative for chills, diaphoresis, fatigue and fever.  HENT: Negative.    Eyes: Negative.   Respiratory:  Positive for wheezing. Negative for cough, chest tightness and shortness of breath.   Cardiovascular:  Negative for chest pain, palpitations and leg swelling.  Gastrointestinal:  Negative for abdominal pain, constipation, diarrhea, nausea and vomiting.  Endocrine: Negative.   Genitourinary: Negative.  Negative for difficulty urinating, dysuria and hematuria.  Musculoskeletal: Negative.  Negative for arthralgias and myalgias.  Skin: Negative.   Neurological:  Negative for dizziness, weakness, light-headedness and headaches.  Hematological:  Negative  for adenopathy. Does not bruise/bleed easily.  Psychiatric/Behavioral: Negative.     Objective:  BP (!) 146/104 (BP Location: Left Arm, Patient Position: Sitting, Cuff Size: Large)   Pulse 88   Temp 98.3 F (36.8 C) (Oral)   Resp 16   Ht 5\' 10"  (1.778 m)   Wt 226 lb (102.5 kg)   SpO2 98%   BMI 32.43 kg/m   BP Readings from Last 3 Encounters:  12/28/20 (!) 146/104  12/18/18 138/86  11/27/18 (!) 150/82    Wt Readings from Last 3 Encounters:  12/28/20 226 lb (102.5 kg)  12/18/18 227 lb (103 kg)  11/27/18 228 lb (103.4 kg)    Physical Exam Vitals reviewed.  Constitutional:      Appearance: Joseph Ferrell is obese. Joseph Ferrell is not ill-appearing.  HENT:     Nose: Nose normal.     Mouth/Throat:     Mouth: Mucous membranes are moist.  Eyes:     Conjunctiva/sclera: Conjunctivae normal.  Cardiovascular:     Rate and Rhythm: Normal rate and regular rhythm.     Heart sounds: No murmur heard. Pulmonary:     Effort: Pulmonary effort is normal.     Breath sounds: No stridor. No wheezing, rhonchi or rales.  Abdominal:     General: Abdomen is flat. Bowel sounds are normal. There is no distension.     Palpations: Abdomen is soft. There is no hepatomegaly, splenomegaly or mass.     Tenderness: There is no abdominal tenderness.  Musculoskeletal:        General: Normal range of motion.     Cervical back: Neck supple.  Right lower leg: No edema.     Left lower leg: No edema.  Lymphadenopathy:     Cervical: No cervical adenopathy.  Skin:    General: Skin is warm and dry.     Coloration: Skin is not pale.  Neurological:     General: No focal deficit present.     Mental Status: Joseph Ferrell is alert.  Psychiatric:        Mood and Affect: Mood normal.        Behavior: Behavior normal.    Lab Results  Component Value Date   WBC 4.9 12/28/2020   HGB 15.4 12/28/2020   HCT 45.7 12/28/2020   PLT 190.0 12/28/2020   GLUCOSE 82 12/28/2020   CHOL 112 05/23/2014   TRIG 58.0 05/23/2014   HDL 53.70  05/23/2014   LDLCALC 47 05/23/2014   ALT 20 12/28/2020   AST 22 12/28/2020   NA 138 12/28/2020   K 3.9 12/28/2020   CL 104 12/28/2020   CREATININE 0.98 12/28/2020   BUN 12 12/28/2020   CO2 26 12/28/2020   TSH 1.14 12/28/2020    No results found.  Assessment & Plan:   Joseph Ferrell was seen today for annual exam, hypertension and asthma.  Diagnoses and all orders for this visit:  Routine general medical examination at a health care facility- Exam completed, labs reviewed, Joseph Ferrell refused vaccines against influenza and pneumonia, no cancer screenings indicated, patient education was given.  Essential hypertension, benign- His blood pressure is not adequately well controlled.  Will check labs to screen for secondary causes and endorgan damage.  Will restart hydrochlorothiazide. -     hydrochlorothiazide (HYDRODIURIL) 25 MG tablet; Take 1 tablet (25 mg total) by mouth daily. -     CBC with Differential/Platelet; Future -     Aldosterone + renin activity w/ ratio; Future -     Hepatic function panel; Future -     TSH; Future -     Urinalysis, Routine w reflex microscopic; Future -     Basic metabolic panel; Future -     Basic metabolic panel -     Urinalysis, Routine w reflex microscopic -     TSH -     Hepatic function panel -     Aldosterone + renin activity w/ ratio -     CBC with Differential/Platelet  LVH (left ventricular hypertrophy)- See above. -     hydrochlorothiazide (HYDRODIURIL) 25 MG tablet; Take 1 tablet (25 mg total) by mouth daily. -     CBC with Differential/Platelet; Future -     Aldosterone + renin activity w/ ratio; Future -     Hepatic function panel; Future -     TSH; Future -     Urinalysis, Routine w reflex microscopic; Future -     Basic metabolic panel; Future -     Basic metabolic panel -     Urinalysis, Routine w reflex microscopic -     TSH -     Hepatic function panel -     Aldosterone + renin activity w/ ratio -     CBC with  Differential/Platelet  Mild intermittent asthma without complication -     albuterol (VENTOLIN HFA) 108 (90 Base) MCG/ACT inhaler; INHALE 2 PUFFS BY MOUTH EVERY 6 HOURS AS NEEDED FOR WHEEZING OR SHORTNESS OF BREATH -     CBC with Differential/Platelet; Future -     fluticasone-salmeterol (WIXELA INHUB) 100-50 MCG/ACT AEPB; Inhale 1 puff into the lungs 2 (two)  times daily. -     CBC with Differential/Platelet  I have discontinued Teena Irani. Berch's Fluticasone-Salmeterol. I am also having Joseph Ferrell start on fluticasone-salmeterol. Additionally, I am having Joseph Ferrell maintain his hydrochlorothiazide and albuterol.  Meds ordered this encounter  Medications   hydrochlorothiazide (HYDRODIURIL) 25 MG tablet    Sig: Take 1 tablet (25 mg total) by mouth daily.    Dispense:  90 tablet    Refill:  0   albuterol (VENTOLIN HFA) 108 (90 Base) MCG/ACT inhaler    Sig: INHALE 2 PUFFS BY MOUTH EVERY 6 HOURS AS NEEDED FOR WHEEZING OR SHORTNESS OF BREATH    Dispense:  18 g    Refill:  3   fluticasone-salmeterol (WIXELA INHUB) 100-50 MCG/ACT AEPB    Sig: Inhale 1 puff into the lungs 2 (two) times daily.    Dispense:  120 each    Refill:  1     Follow-up: Return in about 3 months (around 03/30/2021).  Sanda Linger, MD

## 2020-12-28 NOTE — Patient Instructions (Signed)

## 2021-01-04 ENCOUNTER — Telehealth: Payer: Self-pay | Admitting: Internal Medicine

## 2021-01-04 NOTE — Telephone Encounter (Signed)
Mother Joseph Ferrell requesting call for lab results on 12-28-2020  Please call

## 2021-01-04 NOTE — Telephone Encounter (Signed)
Everything that is resulted is normal. Not all labs from that date are resulted which is likely why they have not been reviewed yet.

## 2021-01-04 NOTE — Telephone Encounter (Signed)
Pt has been informed of results and expressed understanding. He is aware that we are waiting for additional results to be resulted.

## 2021-01-05 LAB — ALDOSTERONE + RENIN ACTIVITY W/ RATIO
ALDO / PRA Ratio: 3.1 Ratio (ref 0.9–28.9)
Aldosterone: 2 ng/dL
Renin Activity: 0.64 ng/mL/h (ref 0.25–5.82)

## 2021-01-10 ENCOUNTER — Encounter: Payer: Self-pay | Admitting: Internal Medicine

## 2021-01-15 ENCOUNTER — Telehealth: Payer: Self-pay | Admitting: Internal Medicine

## 2021-01-15 ENCOUNTER — Other Ambulatory Visit: Payer: Self-pay | Admitting: Internal Medicine

## 2021-01-15 DIAGNOSIS — I10 Essential (primary) hypertension: Secondary | ICD-10-CM

## 2021-01-15 DIAGNOSIS — I517 Cardiomegaly: Secondary | ICD-10-CM

## 2021-01-15 MED ORDER — INDAPAMIDE 1.25 MG PO TABS: 1.2500 mg | ORAL_TABLET | Freq: Every day | ORAL | 0 refills | Status: DC

## 2021-01-15 NOTE — Telephone Encounter (Signed)
Pt. Mother has called and states there is a recall on hydrochlorothiazide (HYDRODIURIL) 25 MG tablet medication. Would like a call back to see what else pt. Can be prescribed.    Callback #- [id number] (pt.)  [phone] (pt. Mother)

## 2021-01-15 NOTE — Telephone Encounter (Signed)
Pt's mother has been informed.

## 2021-01-27 ENCOUNTER — Telehealth: Payer: Self-pay | Admitting: Internal Medicine

## 2021-01-27 NOTE — Telephone Encounter (Signed)
Attempted to return call to the patient/mother ref: cost.  No answer, and unable to leave a voice mail

## 2021-01-27 NOTE — Telephone Encounter (Signed)
Patient's mother [name] is requesting a call back to discuss provider's cost  Phone [phone]

## 2021-02-03 NOTE — Telephone Encounter (Signed)
Patient's Mother Corrie Dandy checking status off call back  Informed mother call was returned on 11-9, mother is requesting a return call  Please call [phone]

## 2021-02-05 ENCOUNTER — Other Ambulatory Visit: Payer: Self-pay

## 2021-02-05 ENCOUNTER — Ambulatory Visit (INDEPENDENT_AMBULATORY_CARE_PROVIDER_SITE_OTHER): Payer: Self-pay

## 2021-02-05 DIAGNOSIS — Z23 Encounter for immunization: Secondary | ICD-10-CM

## 2021-02-05 NOTE — Progress Notes (Signed)
Pt given reg flu vacc w/o any complications. 

## 2021-03-24 ENCOUNTER — Other Ambulatory Visit: Payer: Self-pay | Admitting: Internal Medicine

## 2021-03-24 DIAGNOSIS — I10 Essential (primary) hypertension: Secondary | ICD-10-CM

## 2021-03-24 DIAGNOSIS — I517 Cardiomegaly: Secondary | ICD-10-CM

## 2021-07-24 ENCOUNTER — Ambulatory Visit (HOSPITAL_COMMUNITY)
Admission: EM | Admit: 2021-07-24 | Discharge: 2021-07-24 | Disposition: A | Discharge status: Home / Self Care | Payer: Self-pay | Attending: Physician Assistant | Admitting: Physician Assistant

## 2021-07-24 ENCOUNTER — Encounter (HOSPITAL_COMMUNITY): Payer: Self-pay | Admitting: Emergency Medicine

## 2021-07-24 ENCOUNTER — Other Ambulatory Visit: Payer: Self-pay

## 2021-07-24 ENCOUNTER — Other Ambulatory Visit: Payer: Self-pay | Admitting: Internal Medicine

## 2021-07-24 DIAGNOSIS — I10 Essential (primary) hypertension: Secondary | ICD-10-CM

## 2021-07-24 DIAGNOSIS — J4520 Mild intermittent asthma, uncomplicated: Secondary | ICD-10-CM

## 2021-07-24 DIAGNOSIS — J4541 Moderate persistent asthma with (acute) exacerbation: Secondary | ICD-10-CM

## 2021-07-24 DIAGNOSIS — I517 Cardiomegaly: Secondary | ICD-10-CM

## 2021-07-24 MED ORDER — METHYLPREDNISOLONE SODIUM SUCC 125 MG IJ SOLR
INTRAMUSCULAR | Status: AC
  Filled 2021-07-24: qty 2

## 2021-07-24 MED ORDER — FLUTICASONE-SALMETEROL 100-50 MCG/ACT IN AEPB: 1.0000 | INHALATION_SPRAY | Freq: Two times a day (BID) | RESPIRATORY_TRACT | 1 refills | Status: DC

## 2021-07-24 MED ORDER — METHYLPREDNISOLONE SODIUM SUCC 125 MG IJ SOLR
80.0000 mg | Freq: Once | INTRAMUSCULAR | Status: AC
  Administered 2021-07-24: 80 mg via INTRAMUSCULAR

## 2021-07-24 MED ORDER — IPRATROPIUM-ALBUTEROL 0.5-2.5 (3) MG/3ML IN SOLN
3.0000 mL | Freq: Once | RESPIRATORY_TRACT | Status: AC
  Administered 2021-07-24: 3 mL via RESPIRATORY_TRACT

## 2021-07-24 MED ORDER — PREDNISONE 10 MG (21) PO TBPK: ORAL_TABLET | ORAL | 0 refills | Status: DC

## 2021-07-24 MED ORDER — ALBUTEROL SULFATE HFA 108 (90 BASE) MCG/ACT IN AERS: INHALATION_SPRAY | RESPIRATORY_TRACT | 0 refills | Status: DC

## 2021-07-24 MED ORDER — IPRATROPIUM-ALBUTEROL 0.5-2.5 (3) MG/3ML IN SOLN
RESPIRATORY_TRACT | Status: AC
  Filled 2021-07-24: qty 3

## 2021-07-24 NOTE — ED Triage Notes (Signed)
Sob, wheezing .  Patient reports he thinks this is his asthma and triggered by smoking.  Patient has a cough.  Speaking in complete sentences.  Cough/phlegm noted 2-3 weeks ago.   ?Sob/wheezing noticed last night.  Patient reports a runny nose, no sore throat ?

## 2021-07-24 NOTE — ED Provider Notes (Signed)
?[facility] ? ? ? ?CSN: [id number] ?Arrival date & time: 07/24/21  1009 ? ? ?  ? ?History   ?Chief Complaint ?Chief Complaint  ?Patient presents with  ? Shortness of Breath  ? ? ?HPI ?Joseph Ferrell is a 31 y.o. male.  ? ?Patient presents today with a several week history of wheezing and shortness of breath that is worsened in the past several days.  He has a history of asthma but reports that his maintenance and rescue medication are out of date.  He is requesting refill of these medicines.  He reports cough, wheezing, shortness of breath, rhinorrhea.  Denies any fever, chest pain, nausea, vomiting, sore throat.  Denies any known sick contacts.  Denies any recent antibiotic or steroid use.  He does smoke and believes this could have triggered asthma flare.  He reports hospitalization related to asthma when he was a child but denies any previous intubation.  He is having nocturnal symptoms including coughing interfering with his sleep.  Denies history of diabetes. ? ? ?Past Medical History:  ?Diagnosis Date  ? Allergy   ? Asthma   ? ? ?Patient Active Problem List  ? Diagnosis Date Noted  ? Routine general medical examination at a health care facility 05/23/2014  ? Asthma, intermittent 03/20/2013  ? Essential hypertension, benign 03/20/2013  ? LVH (left ventricular hypertrophy) 03/20/2013  ? ? ?History reviewed. No pertinent surgical history. ? ? ? ? ?Home Medications   ? ?Prior to Admission medications   ?Medication Sig Start Date End Date Taking? Authorizing Provider  ?predniSONE (STERAPRED UNI-PAK 21 TAB) 10 MG (21) TBPK tablet As directed 07/24/21  Yes Hilari Wethington K, PA-C  ?albuterol (VENTOLIN HFA) 108 (90 Base) MCG/ACT inhaler INHALE 2 PUFFS BY MOUTH EVERY 6 HOURS AS NEEDED FOR WHEEZING OR SHORTNESS OF BREATH 07/24/21   Cicero Noy K, PA-C  ?fluticasone-salmeterol (WIXELA INHUB) 100-50 MCG/ACT AEPB Inhale 1 puff into the lungs 2 (two) times daily. 07/24/21   Tirso Laws, Noberto Retort, PA-C  ?indapamide (LOZOL) 1.25  MG tablet Take 1 tablet (1.25 mg total) by mouth daily. 01/15/21   Etta Grandchild, MD  ? ? ?Family History ?Family History  ?Problem Relation Age of Onset  ? Hypertension Mother   ? Alcohol abuse Father   ? Hypertension Father   ? ? ?Social History ?Social History  ? ?Tobacco Use  ? Smoking status: Some Days  ?  Types: Cigars  ? Smokeless tobacco: Never  ? Tobacco comments:  ?  uses electronic cig with vapor  ?Vaping Use  ? Vaping Use: Never used  ?Substance Use Topics  ? Alcohol use: Yes  ?  Alcohol/week: 15.0 standard drinks  ?  Types: 15 Standard drinks or equivalent per week  ? Drug use: Yes  ?  Types: Marijuana  ? ? ? ?Allergies   ?Patient has no known allergies. ? ? ?Review of Systems ?Review of Systems  ?Constitutional:  Positive for activity change. Negative for appetite change, fatigue and fever.  ?HENT:  Positive for rhinorrhea. Negative for congestion, sinus pressure, sneezing and sore throat.   ?Respiratory:  Positive for cough, shortness of breath and wheezing. Negative for chest tightness.   ?Cardiovascular:  Negative for chest pain.  ?Gastrointestinal:  Negative for abdominal pain, diarrhea, nausea and vomiting.  ?Neurological:  Negative for dizziness, light-headedness and headaches.  ? ? ?Physical Exam ?Triage Vital Signs ?ED Triage Vitals  ?Enc Vitals Group  ?   BP 07/24/21 1040 (!) 159/99  ?  Pulse Rate 07/24/21 1040 72  ?   Resp 07/24/21 1040 20  ?   Temp 07/24/21 1040 98.5 ?F (36.9 ?C)  ?   Temp Source 07/24/21 1040 Oral  ?   SpO2 07/24/21 1040 95 %  ?   Weight --   ?   Height --   ?   Head Circumference --   ?   Peak Flow --   ?   Pain Score 07/24/21 1036 0  ?   Pain Loc --   ?   Pain Edu? --   ?   Excl. in GC? --   ? ?No data found. ? ?Updated Vital Signs ?BP (!) 159/99 (BP Location: Right Arm) Comment (BP Location): large cuff  Pulse 72   Temp 98.5 ?F (36.9 ?C) (Oral)   Resp 20   SpO2 95%  ? ?Visual Acuity ?Right Eye Distance:   ?Left Eye Distance:   ?Bilateral Distance:   ? ?Right Eye  Near:   ?Left Eye Near:    ?Bilateral Near:    ? ?Physical Exam ?Vitals reviewed.  ?Constitutional:   ?   General: He is awake.  ?   Appearance: Normal appearance. He is well-developed. He is not ill-appearing.  ?   Comments: Very pleasant male appears stated age in no acute distress sitting comfortably on exam room table  ?HENT:  ?   Head: Normocephalic and atraumatic.  ?   Right Ear: Tympanic membrane, ear canal and external ear normal. Tympanic membrane is not erythematous or bulging.  ?   Left Ear: Tympanic membrane, ear canal and external ear normal. Tympanic membrane is not erythematous or bulging.  ?   Nose: Nose normal.  ?   Mouth/Throat:  ?   Pharynx: Uvula midline. No oropharyngeal exudate or posterior oropharyngeal erythema.  ?Cardiovascular:  ?   Rate and Rhythm: Normal rate and regular rhythm.  ?   Heart sounds: Normal heart sounds, S1 normal and S2 normal. No murmur heard. ?Pulmonary:  ?   Effort: Pulmonary effort is normal. No accessory muscle usage or respiratory distress.  ?   Breath sounds: No stridor. Wheezing present. No rhonchi or rales.  ?   Comments: Widespread wheezing throughout lung fields. ?Abdominal:  ?   General: Bowel sounds are normal.  ?   Palpations: Abdomen is soft.  ?   Tenderness: There is no abdominal tenderness.  ?Neurological:  ?   Mental Status: He is alert.  ?Psychiatric:     ?   Behavior: Behavior is cooperative.  ? ? ? ?UC Treatments / Results  ?Labs ?(all labs ordered are listed, but only abnormal results are displayed) ?Labs Reviewed - No data to display ? ?EKG ? ? ?Radiology ?No results found. ? ?Procedures ?Procedures (including critical care time) ? ?Medications Ordered in UC ?Medications  ?ipratropium-albuterol (DUONEB) 0.5-2.5 (3) MG/3ML nebulizer solution 3 mL (3 mLs Nebulization Given 07/24/21 1125)  ?methylPREDNISolone sodium succinate (SOLU-MEDROL) 125 mg/2 mL injection 80 mg (80 mg Intramuscular Given 07/24/21 1112)  ? ? ?Initial Impression / Assessment and Plan /  UC Course  ?I have reviewed the triage vital signs and the nursing notes. ? ?Pertinent labs & imaging results that were available during my care of the patient were reviewed by me and considered in my medical decision making (see chart for details). ? ?  ? ?Patient was given DuoNeb and Solu-Medrol in clinic today with significant improvement of symptoms.  We will restart previously prescribed maintenance medication as well  as provide refill of albuterol rescue medicine to be used every 4-6 hours as needed.  We will start prednisone taper tomorrow (07/25/2021).  Discussed that he should not take NSAIDs with this medication and risk of GI bleeding.  Can use Mucinex, Tylenol, Flonase for symptom relief.  He is to rest and drink plenty fluid.  Recommended follow-up with primary care next week to ensure continued symptom improvement.  If anything worsens he is to return for reevaluation. ? ?Final Clinical Impressions(s) / UC Diagnoses  ? ?Final diagnoses:  ?Moderate persistent asthma with exacerbation  ? ? ? ?Discharge Instructions   ? ?  ?Your symptoms significantly improved with medication today.  I believe that you are having an asthma flare.  I have sent in a rescue inhaler to be used every 4-6 hours as needed for wheezing and shortness of breath.  Please restart your maintenance medication as previously prescribed.  Make sure to rinse your mouth following use of this medicine to prevent thrush.  Start prednisone tomorrow (07/25/2021).  Do not take NSAIDs including aspirin, ibuprofen/Advil, naproxen/Aleve with this medication as it can cause stomach bleeding.  Use Mucinex and Flonase for additional symptom relief.  Please follow-up with your PCP next week.  If anything worsens return for reevaluation. ? ? ? ? ?ED Prescriptions   ? ? Medication Sig Dispense Auth. Provider  ? albuterol (VENTOLIN HFA) 108 (90 Base) MCG/ACT inhaler INHALE 2 PUFFS BY MOUTH EVERY 6 HOURS AS NEEDED FOR WHEEZING OR SHORTNESS OF BREATH 18 g  Tavari Loadholt K, PA-C  ? fluticasone-salmeterol (WIXELA INHUB) 100-50 MCG/ACT AEPB Inhale 1 puff into the lungs 2 (two) times daily. 120 each Trigger Frasier K, PA-C  ? predniSONE (STERAPRED UNI-PAK 21 TAB) 10 MG (21

## 2021-07-24 NOTE — Discharge Instructions (Signed)
Your symptoms significantly improved with medication today.  I believe that you are having an asthma flare.  I have sent in a rescue inhaler to be used every 4-6 hours as needed for wheezing and shortness of breath.  Please restart your maintenance medication as previously prescribed.  Make sure to rinse your mouth following use of this medicine to prevent thrush.  Start prednisone tomorrow (07/25/2021).  Do not take NSAIDs including aspirin, ibuprofen/Advil, naproxen/Aleve with this medication as it can cause stomach bleeding.  Use Mucinex and Flonase for additional symptom relief.  Please follow-up with your PCP next week.  If anything worsens return for reevaluation. ?

## 2021-08-30 ENCOUNTER — Ambulatory Visit: Payer: Self-pay | Admitting: Internal Medicine

## 2021-09-22 ENCOUNTER — Ambulatory Visit: Payer: Self-pay | Admitting: Internal Medicine

## 2021-10-15 ENCOUNTER — Other Ambulatory Visit: Payer: Self-pay | Admitting: Internal Medicine

## 2021-10-15 DIAGNOSIS — I517 Cardiomegaly: Secondary | ICD-10-CM

## 2021-10-15 DIAGNOSIS — I10 Essential (primary) hypertension: Secondary | ICD-10-CM

## 2022-01-29 ENCOUNTER — Other Ambulatory Visit: Payer: Self-pay | Admitting: Internal Medicine

## 2022-01-29 DIAGNOSIS — I517 Cardiomegaly: Secondary | ICD-10-CM

## 2022-01-29 DIAGNOSIS — I10 Essential (primary) hypertension: Secondary | ICD-10-CM

## 2022-01-31 ENCOUNTER — Telehealth: Payer: Self-pay | Admitting: Internal Medicine

## 2022-01-31 DIAGNOSIS — I10 Essential (primary) hypertension: Secondary | ICD-10-CM

## 2022-01-31 DIAGNOSIS — I517 Cardiomegaly: Secondary | ICD-10-CM

## 2022-01-31 MED ORDER — INDAPAMIDE 1.25 MG PO TABS: 1.2500 mg | ORAL_TABLET | Freq: Every day | ORAL | 0 refills | Status: DC

## 2022-01-31 NOTE — Telephone Encounter (Signed)
Patient made an appointment for 01/07/2022 with Dr. Jonny Ruiz for medication renewal since Dr. Yetta Barre does not have anything until December.  Patient will need his blood pressure medication called in until then.  Please send to [facility] on [address].  He needs at least 6 pills to make it to the appointment.  It is is his indeapamide 1.25 mg.

## 2022-02-07 ENCOUNTER — Ambulatory Visit (INDEPENDENT_AMBULATORY_CARE_PROVIDER_SITE_OTHER): Payer: Self-pay | Admitting: Internal Medicine

## 2022-02-07 ENCOUNTER — Encounter: Payer: Self-pay | Admitting: Internal Medicine

## 2022-02-07 VITALS — BP 140/88 | HR 81 | Temp 97.8°F | Ht 70.0 in | Wt 251.0 lb

## 2022-02-07 DIAGNOSIS — I517 Cardiomegaly: Secondary | ICD-10-CM

## 2022-02-07 DIAGNOSIS — F17200 Nicotine dependence, unspecified, uncomplicated: Secondary | ICD-10-CM

## 2022-02-07 DIAGNOSIS — Z1159 Encounter for screening for other viral diseases: Secondary | ICD-10-CM

## 2022-02-07 DIAGNOSIS — E538 Deficiency of other specified B group vitamins: Secondary | ICD-10-CM

## 2022-02-07 DIAGNOSIS — Z0001 Encounter for general adult medical examination with abnormal findings: Secondary | ICD-10-CM | POA: Insufficient documentation

## 2022-02-07 DIAGNOSIS — E559 Vitamin D deficiency, unspecified: Secondary | ICD-10-CM

## 2022-02-07 DIAGNOSIS — R739 Hyperglycemia, unspecified: Secondary | ICD-10-CM

## 2022-02-07 DIAGNOSIS — I10 Essential (primary) hypertension: Secondary | ICD-10-CM

## 2022-02-07 DIAGNOSIS — J4520 Mild intermittent asthma, uncomplicated: Secondary | ICD-10-CM

## 2022-02-07 DIAGNOSIS — Z114 Encounter for screening for human immunodeficiency virus [HIV]: Secondary | ICD-10-CM

## 2022-02-07 LAB — CBC WITH DIFFERENTIAL/PLATELET
Basophils Absolute: 0 10*3/uL (ref 0.0–0.1)
Basophils Relative: 0.7 % (ref 0.0–3.0)
Eosinophils Absolute: 0.3 10*3/uL (ref 0.0–0.7)
Eosinophils Relative: 4.5 % (ref 0.0–5.0)
HCT: 49.5 % (ref 39.0–52.0)
Hemoglobin: 17.1 g/dL — ABNORMAL HIGH (ref 13.0–17.0)
Lymphocytes Relative: 36.2 % (ref 12.0–46.0)
Lymphs Abs: 2.6 10*3/uL (ref 0.7–4.0)
MCHC: 34.5 g/dL (ref 30.0–36.0)
MCV: 96.3 fl (ref 78.0–100.0)
Monocytes Absolute: 0.9 10*3/uL (ref 0.1–1.0)
Monocytes Relative: 12.2 % — ABNORMAL HIGH (ref 3.0–12.0)
Neutro Abs: 3.4 10*3/uL (ref 1.4–7.7)
Neutrophils Relative %: 46.4 % (ref 43.0–77.0)
Platelets: 229 10*3/uL (ref 150.0–400.0)
RBC: 5.14 Mil/uL (ref 4.22–5.81)
RDW: 14.4 % (ref 11.5–15.5)
WBC: 7.2 10*3/uL (ref 4.0–10.5)

## 2022-02-07 LAB — URINALYSIS, ROUTINE W REFLEX MICROSCOPIC
Bilirubin Urine: NEGATIVE
Hgb urine dipstick: NEGATIVE
Ketones, ur: NEGATIVE
Leukocytes,Ua: NEGATIVE
Nitrite: NEGATIVE
RBC / HPF: NONE SEEN (ref 0–?)
Specific Gravity, Urine: >=1.03 — AB (ref 1.000–1.030)
Total Protein, Urine: NEGATIVE
Urine Glucose: NEGATIVE
Urobilinogen, UA: 0.2 (ref 0.0–1.0)
pH: 6 (ref 5.0–8.0)

## 2022-02-07 LAB — BASIC METABOLIC PANEL
BUN: 11 mg/dL (ref 6–23)
CO2: 28 mEq/L (ref 19–32)
Calcium: 9.3 mg/dL (ref 8.4–10.5)
Chloride: 101 mEq/L (ref 96–112)
Creatinine, Ser: 1.08 mg/dL (ref 0.40–1.50)
GFR: 92.66 mL/min (ref >60.00)
Glucose, Bld: 87 mg/dL (ref 70–99)
Potassium: 3.8 mEq/L (ref 3.5–5.1)
Sodium: 137 mEq/L (ref 135–145)

## 2022-02-07 LAB — LIPID PANEL
Cholesterol: 172 mg/dL (ref 0–200)
HDL: 44.5 mg/dL (ref >39.00)
NonHDL: 127.88
Total CHOL/HDL Ratio: 4
Triglycerides: 231 mg/dL — ABNORMAL HIGH (ref 0.0–149.0)
VLDL: 46.2 mg/dL — ABNORMAL HIGH (ref 0.0–40.0)

## 2022-02-07 LAB — HEPATIC FUNCTION PANEL
ALT: 55 U/L — ABNORMAL HIGH (ref 0–53)
AST: 30 U/L (ref 0–37)
Albumin: 4.6 g/dL (ref 3.5–5.2)
Alkaline Phosphatase: 57 U/L (ref 39–117)
Bilirubin, Direct: 0.1 mg/dL (ref 0.0–0.3)
Total Bilirubin: 0.6 mg/dL (ref 0.2–1.2)
Total Protein: 8 g/dL (ref 6.0–8.3)

## 2022-02-07 LAB — LDL CHOLESTEROL, DIRECT: Direct LDL: 102 mg/dL

## 2022-02-07 LAB — TSH: TSH: 3.73 u[IU]/mL (ref 0.35–5.50)

## 2022-02-07 LAB — VITAMIN D 25 HYDROXY (VIT D DEFICIENCY, FRACTURES): VITD: 20.37 ng/mL — ABNORMAL LOW (ref 30.00–100.00)

## 2022-02-07 LAB — VITAMIN B12: Vitamin B-12: 144 pg/mL — ABNORMAL LOW (ref 211–911)

## 2022-02-07 LAB — HEMOGLOBIN A1C: Hgb A1c MFr Bld: 5.4 % (ref 4.6–6.5)

## 2022-02-07 MED ORDER — INDAPAMIDE 1.25 MG PO TABS: 1.2500 mg | ORAL_TABLET | Freq: Every day | ORAL | 0 refills | Status: DC

## 2022-02-07 MED ORDER — INDAPAMIDE 1.25 MG PO TABS: 1.2500 mg | ORAL_TABLET | Freq: Every day | ORAL | 3 refills | Status: DC

## 2022-02-07 MED ORDER — VALSARTAN 160 MG PO TABS: 160.0000 mg | ORAL_TABLET | Freq: Every day | ORAL | 3 refills | Status: DC

## 2022-02-07 NOTE — Patient Instructions (Signed)
Please take all new medication as prescribed - the diovan 160 mg per day  Please continue all other medications as before, including the lozol  Please call in 2 weeks with your BPs if still averaging more then 140/90 (and better would be less than 130/80)  Please have the pharmacy call with any other refills you may need.  Please continue your efforts at being more active, low cholesterol diet, and weight control.  You are otherwise up to date with prevention measures today.  Please keep your appointments with your specialists as you may have planned  Please go to the LAB at the blood drawing area for the tests to be done  You will be contacted by phone if any changes need to be made immediately.  Otherwise, you will receive a letter about your results with an explanation, but please check with MyChart first.  Please remember to sign up for MyChart if you have not done so, as this will be important to you in the future with finding out test results, communicating by private email, and scheduling acute appointments online when needed.  Please make an Appointment to return in 6 months, or sooner if needed, with Dr Yetta Barre as you leave

## 2022-02-07 NOTE — Progress Notes (Signed)
Patient ID: Joseph Ferrell, male   DOB: 07-17-1990, 31 y.o.   MRN: [id number]         Chief Complaint:: wellness exam and medication renewal (Indapamide)  As PCP not available       HPI:  Joseph Ferrell is a 31 y.o. male here for wellness exam; declines covid booster, flu shot, due for hep c and HIV screening, o/w up to date.  Pt denies chest pain, increased sob or doe, wheezing, orthopnea, PND, increased LE swelling, palpitations, dizziness or syncope.  Needs lozol refill.  Wt has increased 25 lbs! In the last 3 yrs.  Not taking Vit D or b12   Pt denies polydipsia, polyuria, or new focal neuro s/s.    Pt denies fever, wt loss, night sweats, loss of appetite, or other constitutional symptoms     Wt Readings from Last 3 Encounters:  02/07/22 251 lb (113.9 kg)  12/28/20 226 lb (102.5 kg)  12/18/18 227 lb (103 kg)   BP Readings from Last 3 Encounters:  02/07/22 (!) 140/88  07/24/21 (!) 159/99  12/28/20 (!) 146/104   Immunization History  Administered Date(s) Administered   Influenza,inj,Quad PF,6+ Mos 05/06/2013, 05/23/2014, 11/27/2018, 02/05/2021   Pneumococcal Polysaccharide-23 05/06/2013   Tdap 11/19/2007, 11/27/2018  There are no preventive care reminders to display for this patient.    Past Medical History:  Diagnosis Date   Allergy    Asthma    History reviewed. No pertinent surgical history.  reports that he has been smoking cigars. He has never used smokeless tobacco. He reports current alcohol use of about 15.0 standard drinks of alcohol per week. He reports current drug use. Drug: Marijuana. family history includes Alcohol abuse in his father; Hypertension in his father and mother. No Known Allergies Current Outpatient Medications on File Prior to Visit  Medication Sig Dispense Refill   albuterol (VENTOLIN HFA) 108 (90 Base) MCG/ACT inhaler INHALE 2 PUFFS BY MOUTH EVERY 6 HOURS AS NEEDED FOR WHEEZING OR SHORTNESS OF BREATH 18 g 0   fluticasone-salmeterol (WIXELA INHUB)  100-50 MCG/ACT AEPB Inhale 1 puff into the lungs 2 (two) times daily. 120 each 1   predniSONE (STERAPRED UNI-PAK 21 TAB) 10 MG (21) TBPK tablet As directed 21 tablet 0   No current facility-administered medications on file prior to visit.        ROS:  All others reviewed and negative.  Objective        PE:  BP (!) 140/88 (BP Location: Left Arm, Patient Position: Sitting, Cuff Size: Large)   Pulse 81   Temp 97.8 F (36.6 C) (Oral)   Ht 5\' 10"  (1.778 m)   Wt 251 lb (113.9 kg)   SpO2 97%   BMI 36.01 kg/m                 Constitutional: Pt appears in NAD               HENT: Head: NCAT.                Right Ear: External ear normal.                 Left Ear: External ear normal.                Eyes: . Pupils are equal, round, and reactive to light. Conjunctivae and EOM are normal               Nose: without d/c or deformity  Neck: Neck supple. Gross normal ROM               Cardiovascular: Normal rate and regular rhythm.                 Pulmonary/Chest: Effort normal and breath sounds without rales or wheezing.                Abd:  Soft, NT, ND, + BS, no organomegaly               Neurological: Pt is alert. At baseline orientation, motor grossly intact               Skin: Skin is warm. No rashes, no other new lesions, LE edema - none               Psychiatric: Pt behavior is normal without agitation   Micro: none  Cardiac tracings I have personally interpreted today:  none  Pertinent Radiological findings (summarize): none   Lab Results  Component Value Date   WBC 7.2 02/07/2022   HGB 17.1 (H) 02/07/2022   HCT 49.5 02/07/2022   PLT 229.0 02/07/2022   GLUCOSE 87 02/07/2022   CHOL 172 02/07/2022   TRIG 231.0 (H) 02/07/2022   HDL 44.50 02/07/2022   LDLDIRECT 102.0 02/07/2022   LDLCALC 47 05/23/2014   ALT 55 (H) 02/07/2022   AST 30 02/07/2022   NA 137 02/07/2022   K 3.8 02/07/2022   CL 101 02/07/2022   CREATININE 1.08 02/07/2022   BUN 11 02/07/2022   CO2  28 02/07/2022   TSH 3.73 02/07/2022   HGBA1C 5.4 02/07/2022   Assessment/Plan:  Joseph Ferrell is a 31 y.o. Black or African American [2] male with  has a past medical history of Allergy and Asthma.  Encounter for well adult exam with abnormal findings Age and sex appropriate education and counseling updated with regular exercise and diet Referrals for preventative services - none needed Immunizations addressed - declines covid booster, and flu shots Smoking counseling  - counseled to quit, pt not ready Evidence for depression or other mood disorder - none significant Most recent labs reviewed. I have personally reviewed and have noted: 1) the patient's medical and social history 2) The patient's current medications and supplements 3) The patient's height, weight, and BMI have been recorded in the chart   Smoker Pt counsled to quit, pt not ready  Asthma, intermittent Stable overall, continue albuterol hfa prn   Essential hypertension, benign BP Readings from Last 3 Encounters:  02/07/22 (!) 140/88  07/24/21 (!) 159/99  12/28/20 (!) 146/104   Uncontrolled,  pt to start medical treatment diovan 160 mg qd, and restart lozol 1.25 mg qd   LVH (left ventricular hypertrophy) D/w pt this could be precursor to CHF - needs to redouble efforts at BP control, pt to f/u with pcp closely  Vitamin D deficiency Last vitamin D Lab Results  Component Value Date   VD25OH 20.37 (L) 02/07/2022   Low, to start oral replacement   B12 deficiency Lab Results  Component Value Date   VITAMINB12 144 (L) 02/07/2022   Low, to start oral replacement - b12 1000 mcg qd x 6 months  Followup: Return in about 6 months (around 08/08/2022).  Oliver Barre, MD 02/10/2022 1:42 PM [facility]

## 2022-02-07 NOTE — Progress Notes (Signed)
Letter printed and send out  

## 2022-02-08 LAB — HEPATITIS C ANTIBODY: Hepatitis C Ab: NONREACTIVE

## 2022-02-08 LAB — HIV ANTIBODY (ROUTINE TESTING W REFLEX): HIV 1&2 Ab, 4th Generation: NONREACTIVE

## 2022-02-10 ENCOUNTER — Encounter: Payer: Self-pay | Admitting: Internal Medicine

## 2022-02-10 DIAGNOSIS — E538 Deficiency of other specified B group vitamins: Secondary | ICD-10-CM | POA: Insufficient documentation

## 2022-02-10 DIAGNOSIS — E559 Vitamin D deficiency, unspecified: Secondary | ICD-10-CM | POA: Insufficient documentation

## 2022-02-10 DIAGNOSIS — F17200 Nicotine dependence, unspecified, uncomplicated: Secondary | ICD-10-CM | POA: Insufficient documentation

## 2022-02-10 NOTE — Assessment & Plan Note (Signed)
Stable overall, continue albuterol hfa prn 

## 2022-02-10 NOTE — Assessment & Plan Note (Addendum)
BP Readings from Last 3 Encounters:  02/07/22 (!) 140/88  07/24/21 (!) 159/99  12/28/20 (!) 146/104   Uncontrolled,  pt to start medical treatment diovan 160 mg qd, and restart lozol 1.25 mg qd

## 2022-02-10 NOTE — Assessment & Plan Note (Signed)
Pt counsled to quit, pt not ready °

## 2022-02-10 NOTE — Assessment & Plan Note (Signed)
Lab Results  Component Value Date   VITAMINB12 144 (L) 02/07/2022   Low, to start oral replacement - b12 1000 mcg qd x 6 months

## 2022-02-10 NOTE — Assessment & Plan Note (Signed)
D/w pt this could be precursor to CHF - needs to redouble efforts at BP control, pt to f/u with pcp closely

## 2022-02-10 NOTE — Assessment & Plan Note (Signed)
Age and sex appropriate education and counseling updated with regular exercise and diet Referrals for preventative services - none needed Immunizations addressed - declines covid booster, and flu shots Smoking counseling  - counseled to quit, pt not ready Evidence for depression or other mood disorder - none significant Most recent labs reviewed. I have personally reviewed and have noted: 1) the patient's medical and social history 2) The patient's current medications and supplements 3) The patient's height, weight, and BMI have been recorded in the chart

## 2022-02-10 NOTE — Assessment & Plan Note (Signed)
Last vitamin D Lab Results  Component Value Date   VD25OH 20.37 (L) 02/07/2022   Low, to start oral replacement

## 2022-05-17 ENCOUNTER — Telehealth: Payer: Self-pay

## 2022-05-17 ENCOUNTER — Other Ambulatory Visit: Payer: Self-pay | Admitting: Internal Medicine

## 2022-05-17 DIAGNOSIS — J4520 Mild intermittent asthma, uncomplicated: Secondary | ICD-10-CM

## 2022-05-17 MED ORDER — UMECLIDINIUM-VILANTEROL 62.5-25 MCG/ACT IN AEPB: 1.0000 | INHALATION_SPRAY | Freq: Every day | RESPIRATORY_TRACT | 3 refills | Status: DC

## 2022-05-17 NOTE — Telephone Encounter (Signed)
Ok Anoro is sent to [facility]  I am not involved in any application to [place], this is up to patient

## 2022-05-17 NOTE — Telephone Encounter (Signed)
Pts mother has stated the pts inhaler is costing over $100 for the   fluticasone-salmeterol (WIXELA INHUB) 100-50 MCG/ACT AEPB  Pt mother states these other alternatives can be sent in where pt can get it for free through the [place] program.  Possible Alternatives that pt can get from [place] for free until he is able to get health insurance;  [place] [facility]LL[facility]  Pt mother Stanton Kidney can be reached at [phone].

## 2022-05-17 NOTE — Addendum Note (Signed)
Addended by: Biagio Borg on: 05/17/2022 06:27 PM   Modules accepted: Orders

## 2022-05-25 NOTE — Telephone Encounter (Signed)
The mother is sending the application in - we just need to send the prescription to [facility] at the listed FAX number

## 2022-05-25 NOTE — Telephone Encounter (Signed)
Patient reference # [id number]  We need this on the fax so it can go directly to the person that's processing it.

## 2022-05-25 NOTE — Telephone Encounter (Signed)
Patient needs the [facility] re sent to [place] [facility] - Fax:  [phone].  The RX was sent to [facility] and it was $1100.00.

## 2022-05-26 NOTE — Telephone Encounter (Signed)
Mom called and said that Joseph Ferrell has still not received this.  Please resend.

## 2022-05-30 NOTE — Telephone Encounter (Signed)
Please advise as I was not informed of Rx or received any documents

## 2022-05-30 NOTE — Telephone Encounter (Signed)
I was not aware of this while you were out, do you know if it was sent through [place] or not?

## 2022-05-30 NOTE — Telephone Encounter (Signed)
Patients called back wanting an update to see if the prescription was faxed to [facility]. She gave the fax number and it is [phone] with the patient # [id number]. Patient's mother re-stated that the prescription just needs to be faxed and it needs to be a 90-day supply.  Best callback number is [phone].

## 2022-05-30 NOTE — Telephone Encounter (Signed)
I only refilled the anoro to the local [facility]  I decline to be involved with a routine refill done for this patient through a patient assist program, as this would be up to the PCP

## 2022-06-09 NOTE — Telephone Encounter (Signed)
Pt want a phone call she stated this issue has been going too long and she wants to speak with a nurse ASAP about her son (Pt) medication.  Best call back # [phone]

## 2022-06-16 MED ORDER — UMECLIDINIUM-VILANTEROL 62.5-25 MCG/ACT IN AEPB: 1.0000 | INHALATION_SPRAY | Freq: Every day | RESPIRATORY_TRACT | 3 refills | Status: DC

## 2022-06-16 NOTE — Addendum Note (Signed)
Addended by: Hinda Kehr on: 06/16/2022 10:47 AM   Modules accepted: Orders

## 2022-06-16 NOTE — Telephone Encounter (Signed)
Pt's mother has called back in regard.  I have printed Rx, PCP has signed and I have faxed to patient assistance fax nuber provided below.

## 2022-09-12 ENCOUNTER — Other Ambulatory Visit: Payer: Self-pay | Admitting: Internal Medicine

## 2022-09-12 DIAGNOSIS — I10 Essential (primary) hypertension: Secondary | ICD-10-CM

## 2022-09-12 DIAGNOSIS — I517 Cardiomegaly: Secondary | ICD-10-CM

## 2023-02-09 ENCOUNTER — Ambulatory Visit (INDEPENDENT_AMBULATORY_CARE_PROVIDER_SITE_OTHER): Payer: Self-pay | Admitting: Internal Medicine

## 2023-02-09 ENCOUNTER — Telehealth: Payer: Self-pay | Admitting: Internal Medicine

## 2023-02-09 ENCOUNTER — Encounter: Payer: Self-pay | Admitting: Internal Medicine

## 2023-02-09 VITALS — BP 138/82 | HR 78 | Temp 98.1°F | Resp 16 | Ht 68.5 in | Wt 245.8 lb

## 2023-02-09 DIAGNOSIS — E781 Pure hyperglyceridemia: Secondary | ICD-10-CM

## 2023-02-09 DIAGNOSIS — I10 Essential (primary) hypertension: Secondary | ICD-10-CM | POA: Diagnosis not present

## 2023-02-09 DIAGNOSIS — Z0001 Encounter for general adult medical examination with abnormal findings: Secondary | ICD-10-CM | POA: Diagnosis not present

## 2023-02-09 DIAGNOSIS — E538 Deficiency of other specified B group vitamins: Secondary | ICD-10-CM

## 2023-02-09 DIAGNOSIS — J4520 Mild intermittent asthma, uncomplicated: Secondary | ICD-10-CM | POA: Diagnosis not present

## 2023-02-09 DIAGNOSIS — R0609 Other forms of dyspnea: Secondary | ICD-10-CM | POA: Insufficient documentation

## 2023-02-09 DIAGNOSIS — I517 Cardiomegaly: Secondary | ICD-10-CM

## 2023-02-09 LAB — HEPATIC FUNCTION PANEL
ALT: 59 U/L — ABNORMAL HIGH (ref 0–53)
AST: 33 U/L (ref 0–37)
Albumin: 4.9 g/dL (ref 3.5–5.2)
Alkaline Phosphatase: 54 U/L (ref 39–117)
Bilirubin, Direct: 0.1 mg/dL (ref 0.0–0.3)
Total Bilirubin: 0.6 mg/dL (ref 0.2–1.2)
Total Protein: 8.3 g/dL (ref 6.0–8.3)

## 2023-02-09 LAB — LIPID PANEL
Cholesterol: 164 mg/dL (ref 0–200)
HDL: 42.4 mg/dL (ref >39.00)
LDL Cholesterol: 100 mg/dL — ABNORMAL HIGH (ref 0–99)
NonHDL: 121.88
Total CHOL/HDL Ratio: 4
Triglycerides: 108 mg/dL (ref 0.0–149.0)
VLDL: 21.6 mg/dL (ref 0.0–40.0)

## 2023-02-09 LAB — CBC WITH DIFFERENTIAL/PLATELET
Basophils Absolute: 0 10*3/uL (ref 0.0–0.1)
Basophils Relative: 0.6 % (ref 0.0–3.0)
Eosinophils Absolute: 0.3 10*3/uL (ref 0.0–0.7)
Eosinophils Relative: 5.8 % — ABNORMAL HIGH (ref 0.0–5.0)
HCT: 50.7 % (ref 39.0–52.0)
Hemoglobin: 17.2 g/dL — ABNORMAL HIGH (ref 13.0–17.0)
Lymphocytes Relative: 45.1 % (ref 12.0–46.0)
Lymphs Abs: 2.7 10*3/uL (ref 0.7–4.0)
MCHC: 34 g/dL (ref 30.0–36.0)
MCV: 95.7 fL (ref 78.0–100.0)
Monocytes Absolute: 0.7 10*3/uL (ref 0.1–1.0)
Monocytes Relative: 12 % (ref 3.0–12.0)
Neutro Abs: 2.1 10*3/uL (ref 1.4–7.7)
Neutrophils Relative %: 36.5 % — ABNORMAL LOW (ref 43.0–77.0)
Platelets: 202 10*3/uL (ref 150.0–400.0)
RBC: 5.29 Mil/uL (ref 4.22–5.81)
RDW: 14 % (ref 11.5–15.5)
WBC: 5.9 10*3/uL (ref 4.0–10.5)

## 2023-02-09 LAB — BASIC METABOLIC PANEL
BUN: 10 mg/dL (ref 6–23)
CO2: 29 meq/L (ref 19–32)
Calcium: 10.1 mg/dL (ref 8.4–10.5)
Chloride: 98 meq/L (ref 96–112)
Creatinine, Ser: 1.09 mg/dL (ref 0.40–1.50)
GFR: 91 mL/min (ref >60.00)
Glucose, Bld: 99 mg/dL (ref 70–99)
Potassium: 3.7 meq/L (ref 3.5–5.1)
Sodium: 136 meq/L (ref 135–145)

## 2023-02-09 LAB — FOLATE: Folate: 9.6 ng/mL (ref >5.9)

## 2023-02-09 LAB — BRAIN NATRIURETIC PEPTIDE: Pro B Natriuretic peptide (BNP): 7 pg/mL (ref 0.0–100.0)

## 2023-02-09 LAB — VITAMIN B12: Vitamin B-12: 447 pg/mL (ref 211–911)

## 2023-02-09 LAB — TROPONIN I (HIGH SENSITIVITY): High Sens Troponin I: 3 ng/L (ref 2–17)

## 2023-02-09 MED ORDER — TRELEGY ELLIPTA 100-62.5-25 MCG/ACT IN AEPB: 1.0000 | INHALATION_SPRAY | Freq: Every day | RESPIRATORY_TRACT | Status: DC

## 2023-02-09 MED ORDER — VALSARTAN 160 MG PO TABS: 160.0000 mg | ORAL_TABLET | Freq: Every day | ORAL | 1 refills | Status: DC

## 2023-02-09 MED ORDER — INDAPAMIDE 1.25 MG PO TABS: 1.2500 mg | ORAL_TABLET | Freq: Every day | ORAL | 1 refills | Status: DC

## 2023-02-09 MED ORDER — ALBUTEROL SULFATE HFA 108 (90 BASE) MCG/ACT IN AERS: INHALATION_SPRAY | RESPIRATORY_TRACT | 0 refills | Status: AC

## 2023-02-09 MED ORDER — AIRSUPRA 90-80 MCG/ACT IN AERO: 2.0000 | INHALATION_SPRAY | Freq: Four times a day (QID) | RESPIRATORY_TRACT | 1 refills | Status: DC | PRN

## 2023-02-09 NOTE — Patient Instructions (Signed)
Health Maintenance, Male Adopting a healthy lifestyle and getting preventive care are important in promoting health and wellness. Ask your health care provider about: The right schedule for you to have regular tests and exams. Things you can do on your own to prevent diseases and keep yourself healthy. What should I know about diet, weight, and exercise? Eat a healthy diet  Eat a diet that includes plenty of vegetables, fruits, low-fat dairy products, and lean protein. Do not eat a lot of foods that are high in solid fats, added sugars, or sodium. Maintain a healthy weight Body mass index (BMI) is a measurement that can be used to identify possible weight problems. It estimates body fat based on height and weight. Your health care provider can help determine your BMI and help you achieve or maintain a healthy weight. Get regular exercise Get regular exercise. This is one of the most important things you can do for your health. Most adults should: Exercise for at least 150 minutes each week. The exercise should increase your heart rate and make you sweat (moderate-intensity exercise). Do strengthening exercises at least twice a week. This is in addition to the moderate-intensity exercise. Spend less time sitting. Even light physical activity can be beneficial. Watch cholesterol and blood lipids Have your blood tested for lipids and cholesterol at 30 years of age, then have this test every 5 years. You may need to have your cholesterol levels checked more often if: Your lipid or cholesterol levels are high. You are older than 30 years of age. You are at high risk for heart disease. What should I know about cancer screening? Many types of cancers can be detected early and may often be prevented. Depending on your health history and family history, you may need to have cancer screening at various ages. This may include screening for: Colorectal cancer. Prostate cancer. Skin cancer. Lung  cancer. What should I know about heart disease, diabetes, and high blood pressure? Blood pressure and heart disease High blood pressure causes heart disease and increases the risk of stroke. This is more likely to develop in people who have high blood pressure readings or are overweight. Talk with your health care provider about your target blood pressure readings. Have your blood pressure checked: Every 3-5 years if you are 18-39 years of age. Every year if you are 40 years old or older. If you are between the ages of 65 and 75 and are a current or former smoker, ask your health care provider if you should have a one-time screening for abdominal aortic aneurysm (AAA). Diabetes Have regular diabetes screenings. This checks your fasting blood sugar level. Have the screening done: Once every three years after age 45 if you are at a normal weight and have a low risk for diabetes. More often and at a younger age if you are overweight or have a high risk for diabetes. What should I know about preventing infection? Hepatitis B If you have a higher risk for hepatitis B, you should be screened for this virus. Talk with your health care provider to find out if you are at risk for hepatitis B infection. Hepatitis C Blood testing is recommended for: Everyone born from 1945 through 1965. Anyone with known risk factors for hepatitis C. Sexually transmitted infections (STIs) You should be screened each year for STIs, including gonorrhea and chlamydia, if: You are sexually active and are younger than 30 years of age. You are older than 30 years of age and your   health care provider tells you that you are at risk for this type of infection. Your sexual activity has changed since you were last screened, and you are at increased risk for chlamydia or gonorrhea. Ask your health care provider if you are at risk. Ask your health care provider about whether you are at high risk for HIV. Your health care provider  may recommend a prescription medicine to help prevent HIV infection. If you choose to take medicine to prevent HIV, you should first get tested for HIV. You should then be tested every 3 months for as long as you are taking the medicine. Follow these instructions at home: Alcohol use Do not drink alcohol if your health care provider tells you not to drink. If you drink alcohol: Limit how much you have to 0-2 drinks a day. Know how much alcohol is in your drink. In the [place] one drink equals one 12 oz bottle of beer (355 mL), one 5 oz glass of wine (148 mL), or one 1 oz glass of hard liquor (44 mL). Lifestyle Do not use any products that contain nicotine or tobacco. These products include cigarettes, chewing tobacco, and vaping devices, such as e-cigarettes. If you need help quitting, ask your health care provider. Do not use street drugs. Do not share needles. Ask your health care provider for help if you need support or information about quitting drugs. General instructions Schedule regular health, dental, and eye exams. Stay current with your vaccines. Tell your health care provider if: You often feel depressed. You have ever been abused or do not feel safe at home. Summary Adopting a healthy lifestyle and getting preventive care are important in promoting health and wellness. Follow your health care provider's instructions about healthy diet, exercising, and getting tested or screened for diseases. Follow your health care provider's instructions on monitoring your cholesterol and blood pressure. This information is not intended to replace advice given to you by your health care provider. Make sure you discuss any questions you have with your health care provider. Document Revised: 07/27/2020 Document Reviewed: 07/27/2020 Elsevier Patient Education  2024 Elsevier Inc.  

## 2023-02-09 NOTE — Progress Notes (Signed)
Subjective:  Patient ID: Joseph Ferrell, male    DOB: 28-Dec-1990  Age: 32 y.o. MRN: [id number]  CC: Annual Exam, Hypertension, Hyperlipidemia, and Asthma   HPI Joseph Ferrell presents for a CPX and f/up -   Discussed the use of AI scribe software for clinical note transcription with the patient, who gave verbal consent to proceed.  History of Present Illness   The patient, with a history of hypertension, asthma, and occasional smoking, reports no recent symptoms of high blood pressure such as headaches or blurred vision. He denies chest pain but admits to occasional shortness of breath, particularly during physical activity such as playing football. This shortness of breath has been present for approximately six months. He uses an Anoro inhaler for his asthma.  The patient has recently started taking vitamins B12 and D3, but there have been no changes to his regular medication regimen. He denies any side effects from his blood pressure medications.       Outpatient Medications Prior to Visit  Medication Sig Dispense Refill  . Cholecalciferol (VITAMIN D3) 50 MCG (2000 UT) TABS Take by mouth daily.    . vitamin B-12 (CYANOCOBALAMIN) 50 MCG tablet Take 50 mcg by mouth daily.    Marland Kitchen albuterol (VENTOLIN HFA) 108 (90 Base) MCG/ACT inhaler INHALE 2 PUFFS BY MOUTH EVERY 6 HOURS AS NEEDED FOR WHEEZING OR SHORTNESS OF BREATH 18 g 0  . indapamide (LOZOL) 1.25 MG tablet Take 1 tablet (1.25 mg total) by mouth daily. 90 tablet 3  . umeclidinium-vilanterol (ANORO ELLIPTA) 62.5-25 MCG/ACT AEPB Inhale 1 puff into the lungs daily at 6 (six) AM. PATIENT ASSISTANCE 3 each 3  . valsartan (DIOVAN) 160 MG tablet Take 1 tablet (160 mg total) by mouth daily. 90 tablet 3   No facility-administered medications prior to visit.    ROS Review of Systems  Constitutional:  Negative for appetite change, chills, diaphoresis, fatigue and fever.  HENT: Negative.  Negative for sore throat and trouble swallowing.    Eyes: Negative.   Respiratory:  Positive for shortness of breath and wheezing. Negative for cough, chest tightness and stridor.   Cardiovascular:  Negative for chest pain, palpitations and leg swelling.  Gastrointestinal:  Negative for abdominal pain, diarrhea, nausea and vomiting.  Endocrine: Negative.   Genitourinary: Negative.  Negative for difficulty urinating, scrotal swelling and testicular pain.  Musculoskeletal: Negative.  Negative for arthralgias and myalgias.  Skin: Negative.   Neurological: Negative.  Negative for dizziness and weakness.  Hematological:  Negative for adenopathy. Does not bruise/bleed easily.  Psychiatric/Behavioral: Negative.      Objective:  BP 138/82   Pulse 78   Temp 98.1 F (36.7 C) (Oral)   Resp 16   Ht 5' 8.5" (1.74 m)   Wt 245 lb 12.8 oz (111.5 kg)   SpO2 96%   BMI 36.83 kg/m   BP Readings from Last 3 Encounters:  02/09/23 138/82  02/07/22 (!) 140/88  07/24/21 (!) 159/99    Wt Readings from Last 3 Encounters:  02/09/23 245 lb 12.8 oz (111.5 kg)  02/07/22 251 lb (113.9 kg)  12/28/20 226 lb (102.5 kg)    Physical Exam Vitals reviewed.  Constitutional:      Appearance: Normal appearance.  HENT:     Mouth/Throat:     Mouth: Mucous membranes are moist.  Eyes:     General: No scleral icterus.    Conjunctiva/sclera: Conjunctivae normal.  Cardiovascular:     Rate and Rhythm: Normal rate and regular  rhythm.     Heart sounds: No murmur heard.    No friction rub. No gallop.     Comments: EKG- NSR with SA, 72 bpm Minimal LVH No Q waves or acute ST/T wave changes Unchanged  Pulmonary:     Effort: Pulmonary effort is normal. No respiratory distress.     Breath sounds: No stridor. Examination of the right-upper field reveals wheezing. Examination of the left-upper field reveals wheezing. Examination of the right-middle field reveals wheezing. Examination of the left-middle field reveals wheezing. Examination of the right-lower field  reveals wheezing. Examination of the left-lower field reveals wheezing. Wheezing present. No decreased breath sounds, rhonchi or rales.     Comments: Diffuse B exp wheezes Abdominal:     General: Abdomen is flat.     Palpations: There is no mass.     Tenderness: There is no abdominal tenderness. There is no guarding.     Hernia: No hernia is present. There is no hernia in the left inguinal area or right inguinal area.  Genitourinary:    Pubic Area: No rash.      Penis: Normal and circumcised.      Testes: Normal.        Right: Mass, tenderness, swelling, testicular hydrocele or varicocele not present. Right testis is descended. Cremasteric reflex is present.         Left: Mass, tenderness, swelling, testicular hydrocele or varicocele not present. Left testis is descended. Cremasteric reflex is present.      Epididymis:     Right: Normal. Not inflamed or enlarged. No mass or tenderness.     Left: Normal. Not inflamed or enlarged. No mass or tenderness.  Musculoskeletal:        General: Normal range of motion.     Cervical back: Neck supple.     Right lower leg: No edema.     Left lower leg: No edema.  Lymphadenopathy:     Cervical: No cervical adenopathy.     Lower Body: No right inguinal adenopathy. No left inguinal adenopathy.  Skin:    General: Skin is warm and dry.  Neurological:     General: No focal deficit present.     Mental Status: He is alert. Mental status is at baseline.  Psychiatric:        Mood and Affect: Mood normal.        Behavior: Behavior normal.    Lab Results  Component Value Date   WBC 5.9 02/09/2023   HGB 17.2 (H) 02/09/2023   HCT 50.7 02/09/2023   PLT 202.0 02/09/2023   GLUCOSE 99 02/09/2023   CHOL 164 02/09/2023   TRIG 108.0 02/09/2023   HDL 42.40 02/09/2023   LDLDIRECT 102.0 02/07/2022   LDLCALC 100 (H) 02/09/2023   ALT 59 (H) 02/09/2023   AST 33 02/09/2023   NA 136 02/09/2023   K 3.7 02/09/2023   CL 98 02/09/2023   CREATININE 1.09  02/09/2023   BUN 10 02/09/2023   CO2 29 02/09/2023   TSH 3.73 02/07/2022   HGBA1C 5.4 02/07/2022    No results found.  Assessment & Plan:   Mild intermittent asthma without complication- I think he should start an ICS. -     Trelegy Ellipta; Inhale 1 puff into the lungs daily. Paulene Floor; Inhale 2 puffs into the lungs 4 (four) times daily as needed.  Dispense: 32.1 g; Refill: 1 -     CBC with Differential/Platelet; Future  DOE (dyspnea on exertion)-  Labs and EKG are reassuring. -     Brain natriuretic peptide; Future -     Troponin I (High Sensitivity); Future  Essential hypertension, benign - His BP is well controlled. -     Basic metabolic panel; Future -     CBC with Differential/Platelet; Future -     Hepatic function panel; Future -     EKG 12-Lead -     Valsartan; Take 1 tablet (160 mg total) by mouth daily.  Dispense: 90 tablet; Refill: 1 -     Indapamide; Take 1 tablet (1.25 mg total) by mouth daily.  Dispense: 90 tablet; Refill: 1  LVH (left ventricular hypertrophy) -     Brain natriuretic peptide; Future -     Troponin I (High Sensitivity); Future -     Valsartan; Take 1 tablet (160 mg total) by mouth daily.  Dispense: 90 tablet; Refill: 1 -     Indapamide; Take 1 tablet (1.25 mg total) by mouth daily.  Dispense: 90 tablet; Refill: 1  B12 deficiency- B12 and folate are normal. -     Vitamin B12; Future -     Folate; Future -     CBC with Differential/Platelet; Future  Pure hyperglyceridemia -     Lipid panel; Future -     Hepatic function panel; Future  Encounter for general adult medical examination with abnormal findings - Exam completed, labs reviewed, vaccines reviewed, no cancer screenings indicated, pt ed material was given.      Follow-up: Return in about 6 months (around 08/09/2023).  Sanda Linger, MD

## 2023-02-09 NOTE — Telephone Encounter (Signed)
Returned call to patient's mother Joseph Ferrell who is on DPR and informed her that the albuterol inhaler was sent to the pharmacy on file. She thanked me for calling.

## 2023-02-09 NOTE — Telephone Encounter (Signed)
Pt mother called wanting to switch to the regular albuterol because the Albuterol- Budesonide because pharmacy is out of it and she want it sent over to the pharmacy so she can pick it up.

## 2023-02-09 NOTE — Telephone Encounter (Signed)
Spoke with Dr. Yetta Barre and he is okay with sending the albuterol inhaler to patient's pharmacy. Will call patient and make him and his mother to make aware.

## 2023-02-10 ENCOUNTER — Encounter: Payer: Self-pay | Admitting: Internal Medicine

## 2023-03-08 ENCOUNTER — Other Ambulatory Visit: Payer: Self-pay | Admitting: Internal Medicine

## 2023-03-08 DIAGNOSIS — J4520 Mild intermittent asthma, uncomplicated: Secondary | ICD-10-CM

## 2023-03-08 NOTE — Telephone Encounter (Signed)
Copied from CRM [id number]. Topic: Clinical - Medication Refill >> Mar 08, 2023 11:15 AM Irine Seal wrote: Most Recent Primary Care Visit:  Provider: Etta Grandchild  Department: [facility]  Visit Type: PHYSICAL  Date: 02/09/2023  Medication: Albuterol-Budesonide (AIRSUPRA)  Has the patient contacted their pharmacy? Yes (Agent: If no, request that the patient contact the pharmacy for the refill. If patient does not wish to contact the pharmacy document the reason why and proceed with request.) (Agent: If yes, when and what did the pharmacy advise?)  Is this the correct pharmacy for this prescription? Yes If no, delete pharmacy and type the correct one.  This is the patient's preferred pharmacy:  [facility] [address] [place] - [address] [place] [address] Phone: [phone] Fax: [phone]   Has the prescription been filled recently? No  Is the patient out of the medication? Yes  Has the patient been seen for an appointment in the last year OR does the patient have an upcoming appointment? Yes  Can we respond through MyChart? Yes  Agent: Please be advised that Rx refills may take up to 3 business days. We ask that you follow-up with your pharmacy.

## 2023-03-14 MED ORDER — AIRSUPRA 90-80 MCG/ACT IN AERO: 2.0000 | INHALATION_SPRAY | Freq: Four times a day (QID) | RESPIRATORY_TRACT | 1 refills | Status: DC | PRN

## 2023-03-21 ENCOUNTER — Other Ambulatory Visit (HOSPITAL_COMMUNITY): Payer: Self-pay

## 2023-03-21 ENCOUNTER — Telehealth: Payer: Self-pay

## 2023-03-21 NOTE — Telephone Encounter (Signed)
 Pharmacy Patient Advocate Encounter   Received notification from CoverMyMeds that prior authorization for Airsupra  90-80MCG/ACT aerosol is required/requested.   Insurance verification completed.   The patient is insured through [facility] .   Per test claim: PA required; PA submitted to above mentioned insurance via CoverMyMeds Key/confirmation #/EOC [id number] Status is pending

## 2023-03-23 ENCOUNTER — Other Ambulatory Visit (HOSPITAL_COMMUNITY): Payer: Self-pay

## 2023-03-24 ENCOUNTER — Other Ambulatory Visit (HOSPITAL_COMMUNITY): Payer: Self-pay

## 2023-03-24 NOTE — Telephone Encounter (Signed)
 Pharmacy Patient Advocate Encounter  Received notification from [facility] that Prior Authorization for Airsupra  90-80MCG/ACT aeroso has been DENIED.  Full denial letter will be uploaded to the media tab. See denial reason below.   PA #/Case ID/Reference #: [id number]  Denial Reason: Patient must try at least 3 of the following drugs prior to Airsupra  being approved; Symbicort , Breo, Advair Diskus, Albuterol  HFA or Levalbuterol.

## 2023-04-13 ENCOUNTER — Telehealth: Payer: Self-pay | Admitting: Internal Medicine

## 2023-04-13 NOTE — Telephone Encounter (Signed)
Copied from CRM [id number]. Topic: General - Other >> Apr 13, 2023  8:14 AM Elizebeth Brooking wrote: Reason for CRM: Patient mom called in regarding a form that needed to be signed by DR.jones confirming that he was diagnosed with asthma in order to get the mattress pillow case cover is requesting a callback  ---  Have we received the form?

## 2023-04-14 NOTE — Telephone Encounter (Signed)
Advised patient's mom that we have haven't received the paper and for her to have them refax the form. She gave a verbal understanding.

## 2023-05-19 ENCOUNTER — Telehealth: Payer: Self-pay | Admitting: Pharmacy Technician

## 2023-05-19 ENCOUNTER — Other Ambulatory Visit (HOSPITAL_COMMUNITY): Payer: Self-pay

## 2023-05-19 NOTE — Telephone Encounter (Signed)
 Pharmacy Patient Advocate Encounter   Received notification from Onbase that prior authorization for AIRSUPRA 90-80 MCG INHALER is required/requested.   Insurance verification completed.   The patient is insured through [facility] MEDICAID .   Per test claim:  ADVAIR DISKUS, ADVAIR HFA, DULERA INHALER, SYMBICORT INHALER is preferred by the insurance.  If suggested medication is appropriate, Please send in a new RX and discontinue this one. If not, please advise as to why it's not appropriate so that we may request a Prior Authorization. Please note, some preferred medications may still require a PA.  If the suggested medications have not been trialed and there are no contraindications to their use, the PA will not be submitted, as it will not be approved.

## 2023-05-19 NOTE — Telephone Encounter (Signed)
 Please proceed with the PA

## 2023-05-19 NOTE — Telephone Encounter (Signed)
Yes he has

## 2023-05-19 NOTE — Telephone Encounter (Signed)
 Has the patient failed at least 2 of the preferred alternatives?   ADVAIR DISKUS, ADVAIR HFA, DULERA INHALER, SYMBICORT INHALER is preferred by the insurance      Key: [id number]

## 2023-05-22 NOTE — Telephone Encounter (Signed)
 Clinical questions answered and PA submitted   Key: [id number]

## 2023-05-23 ENCOUNTER — Other Ambulatory Visit (HOSPITAL_COMMUNITY): Payer: Self-pay

## 2023-05-23 NOTE — Telephone Encounter (Signed)
 Patients insurance has been changed can you Re Run the PA for this under united healthcare ?

## 2023-05-23 NOTE — Telephone Encounter (Signed)
 Patients mother has been made aware. She gave a verbal understanding and states that she will give his insurance company a call

## 2023-05-23 NOTE — Telephone Encounter (Signed)
 Pharmacy Patient Advocate Encounter  Received notification from  [employer]  that Prior Authorization for Airsupra 90-86mcg has been DENIED.  See denial reason below. No denial letter attached in CMM. Will attach denial letter to Media tab once received.   PA #/Case ID/Reference #: [id number]

## 2023-06-01 ENCOUNTER — Telehealth: Payer: Self-pay

## 2023-06-01 NOTE — Telephone Encounter (Signed)
 Copied from CRM [id number]. Topic: Clinical - Medication Question >> Jun 01, 2023 10:25 AM Joseph Ferrell wrote: Reason for CRM: Patients mom is calling to see if office has anymore samples of the Fluticasone-Umeclidin-Vilant (TRELEGY ELLIPTA) 100-62.5-25 MCG/ACT AEPB in office until they can get issue worked out with insurance and air supply company. Mom states she's dropping off some paperwork today to clinic to see if medication can be approved, mom states let her know if she needs to appeal or do anything else. Please reach out to mom and let her know if samples are available, thanks.  Joseph Ferrell [phone]

## 2023-06-06 NOTE — Telephone Encounter (Signed)
 PA was verbally done over the phone by me with medicaid. They advised me it would take up to 24 hours to receive a determination. I advise the patients mother she gave a verbal understanding. Also advised her we didn't have any samples.

## 2023-06-10 ENCOUNTER — Other Ambulatory Visit: Payer: Self-pay | Admitting: Internal Medicine

## 2023-06-10 DIAGNOSIS — J4520 Mild intermittent asthma, uncomplicated: Secondary | ICD-10-CM

## 2023-06-10 MED ORDER — BUDESONIDE-FORMOTEROL FUMARATE 80-4.5 MCG/ACT IN AERO: 2.0000 | INHALATION_SPRAY | Freq: Two times a day (BID) | RESPIRATORY_TRACT | 3 refills | Status: DC

## 2023-06-14 ENCOUNTER — Telehealth: Payer: Self-pay | Admitting: Internal Medicine

## 2023-06-14 NOTE — Telephone Encounter (Signed)
 Copied from CRM [id number]. Topic: General - Other >> Jun 13, 2023  4:45 PM Florestine Avers wrote: Reason for CRM: Patients mother called in stating that she is trying to get in contact with "Jas" and for her to please give her a call back in regards to his medication. She thinks that they have sent over the wrong medication. Tacey Ruiz was supposed to be the medication rx'd and it was not please contact Barbette Merino at [phone].

## 2023-06-14 NOTE — Telephone Encounter (Signed)
 Advised patient's mom that I didn't do a PA for trelergy inhaler only the Airsupra. She gave a verbal understanding.

## 2023-06-26 ENCOUNTER — Telehealth: Payer: Self-pay

## 2023-06-26 ENCOUNTER — Other Ambulatory Visit (HOSPITAL_COMMUNITY): Payer: Self-pay

## 2023-06-26 NOTE — Telephone Encounter (Signed)
 Pharmacy Patient Advocate Encounter   Received notification from CoverMyMeds that prior authorization for Breyna 80-4.5MCG/ACT aerosol is required/requested.   Insurance verification completed.   The patient is insured through [facility] MEDICAID.   Per test claim: Refill too soon. PA is not needed at this time. Medication was filled 06/10/23. Next eligible fill date is 07/05/23.

## 2023-09-27 ENCOUNTER — Telehealth: Payer: Self-pay | Admitting: Internal Medicine

## 2023-09-27 NOTE — Telephone Encounter (Signed)
 Copied from CRM [id number]. Topic: Clinical - Medication Question >> Sep 27, 2023  9:45 AM Chiquita SQUIBB wrote: Reason for CRM: Patients mother is calling in requesting if the patient can go back to the old inhaler he had which was AirSuper because they have a new insurance that will cover the inhaler, and it works a lot better for the patient.

## 2023-10-03 ENCOUNTER — Other Ambulatory Visit (HOSPITAL_COMMUNITY): Payer: Self-pay

## 2023-12-18 ENCOUNTER — Ambulatory Visit (INDEPENDENT_AMBULATORY_CARE_PROVIDER_SITE_OTHER): Admitting: Internal Medicine

## 2023-12-18 ENCOUNTER — Encounter: Payer: Self-pay | Admitting: Internal Medicine

## 2023-12-18 ENCOUNTER — Ambulatory Visit: Payer: Self-pay

## 2023-12-18 VITALS — BP 164/98 | HR 87 | Temp 98.6°F | Ht 68.5 in | Wt 252.0 lb

## 2023-12-18 DIAGNOSIS — J4520 Mild intermittent asthma, uncomplicated: Secondary | ICD-10-CM | POA: Diagnosis not present

## 2023-12-18 DIAGNOSIS — I517 Cardiomegaly: Secondary | ICD-10-CM | POA: Diagnosis not present

## 2023-12-18 DIAGNOSIS — R7989 Other specified abnormal findings of blood chemistry: Secondary | ICD-10-CM | POA: Diagnosis not present

## 2023-12-18 DIAGNOSIS — I861 Scrotal varices: Secondary | ICD-10-CM | POA: Diagnosis not present

## 2023-12-18 DIAGNOSIS — I10 Essential (primary) hypertension: Secondary | ICD-10-CM

## 2023-12-18 DIAGNOSIS — F1220 Cannabis dependence, uncomplicated: Secondary | ICD-10-CM | POA: Insufficient documentation

## 2023-12-18 LAB — HEPATIC FUNCTION PANEL
ALT: 44 U/L (ref 0–53)
AST: 21 U/L (ref 0–37)
Albumin: 4.9 g/dL (ref 3.5–5.2)
Alkaline Phosphatase: 61 U/L (ref 39–117)
Bilirubin, Direct: 0.1 mg/dL (ref 0.0–0.3)
Total Bilirubin: 0.7 mg/dL (ref 0.2–1.2)
Total Protein: 8.2 g/dL (ref 6.0–8.3)

## 2023-12-18 LAB — URINALYSIS, ROUTINE W REFLEX MICROSCOPIC
Bilirubin Urine: NEGATIVE
Hgb urine dipstick: NEGATIVE
Ketones, ur: NEGATIVE
Leukocytes,Ua: NEGATIVE
Nitrite: NEGATIVE
RBC / HPF: NONE SEEN (ref 0–?)
Specific Gravity, Urine: 1.01 (ref 1.000–1.030)
Total Protein, Urine: NEGATIVE
Urine Glucose: NEGATIVE
Urobilinogen, UA: 0.2 (ref 0.0–1.0)
pH: 6 (ref 5.0–8.0)

## 2023-12-18 LAB — BASIC METABOLIC PANEL WITH GFR
BUN: 10 mg/dL (ref 6–23)
CO2: 27 meq/L (ref 19–32)
Calcium: 9.8 mg/dL (ref 8.4–10.5)
Chloride: 99 meq/L (ref 96–112)
Creatinine, Ser: 1.01 mg/dL (ref 0.40–1.50)
GFR: 99.12 mL/min (ref >60.00)
Glucose, Bld: 85 mg/dL (ref 70–99)
Potassium: 3.7 meq/L (ref 3.5–5.1)
Sodium: 135 meq/L (ref 135–145)

## 2023-12-18 LAB — CBC WITH DIFFERENTIAL/PLATELET
Basophils Absolute: 0 K/uL (ref 0.0–0.1)
Basophils Relative: 0.5 % (ref 0.0–3.0)
Eosinophils Absolute: 0.3 K/uL (ref 0.0–0.7)
Eosinophils Relative: 5.1 % — ABNORMAL HIGH (ref 0.0–5.0)
HCT: 47 % (ref 39.0–52.0)
Hemoglobin: 15.9 g/dL (ref 13.0–17.0)
Lymphocytes Relative: 38.5 % (ref 12.0–46.0)
Lymphs Abs: 2.3 K/uL (ref 0.7–4.0)
MCHC: 33.9 g/dL (ref 30.0–36.0)
MCV: 93.2 fl (ref 78.0–100.0)
Monocytes Absolute: 0.6 K/uL (ref 0.1–1.0)
Monocytes Relative: 10.5 % (ref 3.0–12.0)
Neutro Abs: 2.7 K/uL (ref 1.4–7.7)
Neutrophils Relative %: 45.4 % (ref 43.0–77.0)
Platelets: 194 K/uL (ref 150.0–400.0)
RBC: 5.04 Mil/uL (ref 4.22–5.81)
RDW: 13.8 % (ref 11.5–15.5)
WBC: 6 K/uL (ref 4.0–10.5)

## 2023-12-18 LAB — PROTIME-INR
INR: 1.1 ratio — ABNORMAL HIGH (ref 0.8–1.0)
Prothrombin Time: 11.7 s (ref 9.6–13.1)

## 2023-12-18 NOTE — Telephone Encounter (Signed)
 Lump in left testicle Painless Been there for a year Peanut or smaller size No itching, injuries, fever, urination issues  FYI Only or Action Required?: FYI only for provider.  Patient was last seen in primary care on 02/09/2023 by Joshua Debby CROME, MD.  Called Nurse Triage reporting [place].  Symptoms began about a year ago.  Interventions attempted: Rest, hydration, or home remedies.  Symptoms are: unchanged.  Triage Disposition: See PCP When Office is Open (Within 3 Days)  Patient/caregiver understands and will follow disposition?: Yes            Copied from CRM #[id number]. Topic: Clinical - Red Word Triage >> Dec 18, 2023  8:14 AM Joseph Ferrell wrote: Red Word that prompted transfer to Nurse Triage: nodule on left testicle / no other symptoms Reason for Disposition  All other penis - scrotum symptoms  (Exception: Painless rash < 24 hours duration.)  Answer Assessment - Initial Assessment Questions 1. SYMPTOM: What's the main symptom you're concerned about? (e.g., blood in semen, discharge or pus from penis, itching, pain, rash, swelling)     Lump in left testicle  about a peanut size 2. LOCATION: Where is the lump located?     Left testicle 3. ONSET: When did lump  start?     Over a year ago 4. PAIN: Is there any pain? If Yes, ask: How bad is it?  (Scale 1-10; or mild, moderate, severe)     Patient denies 5. URINE: Any difficulty passing urine? If Yes, ask: When was the last time?     no 6. CAUSE: What do you think is causing the symptoms?     unsure 7. OTHER SYMPTOMS: Do you have any other symptoms? (e.g., blood in urine, abdomen pain, fever)     Patient denies  Protocols used: Penis and Scrotum Symptoms-A-AH

## 2023-12-18 NOTE — Patient Instructions (Signed)
 Varicocele  A varicocele is a swelling of veins in the scrotum. The scrotum is the sac that contains the testicles. Varicoceles can occur on either side of the scrotum, but they are more common on the left side. They occur most often in teenage boys and young men. In most cases, varicoceles are not a serious problem. They are usually small and painless and do not require treatment. Tests may be done to confirm the diagnosis. Treatment may be needed if: A varicocele is large, causes a lot of pain, or causes pain when exercising. Varicoceles are found on both sides of the scrotum. A varicocele causes a decrease in the size of the testicle in a growing adolescent. A varicocele makes it hard to get someone pregnant (infertility). What are the causes? This condition is caused by valves in the veins not working properly. Valves in the veins help to return blood from the scrotum and testicles to the heart. If these valves do not work well, blood flows backward and backs up into the veins, which causes the veins to swell. This is similar to what happens when varicose veins form in the leg. What are the signs or symptoms? Most varicoceles do not cause any symptoms. If symptoms do occur, they may include: Swelling on one side of the scrotum. The swelling may be more obvious when you are standing up. A lumpy feeling in the scrotum. A heavy feeling on one side of the scrotum. A dull ache in the scrotum, especially after exercise or prolonged standing or sitting. Slower growth or reduced size of the testicle on the side of the varicocele. This happens in young males. Infertility. This can occur if the testicle does not grow normally or if the condition causes problems with the sperm, such as a low sperm count or sperm that are not able to reach the egg. How is this diagnosed? This condition is diagnosed based on: Your medical history. A physical exam. Your health care provider may check and feel the scrotum  area to check for swollen or enlarged veins. An ultrasound. This may be done to confirm the diagnosis and to help rule out other causes of the swelling. How is this treated? Treatment is usually not needed for this condition. If you have any pain, your provider may give you medicine to help relieve it. Your provider may need to do tests to make sure that your varicocele does not cause problems. If further treatment is needed, you may have one of these procedures: Varicocelectomy. This is surgery to tie off the swollen veins so that the flow of blood goes to other veins instead. Embolization. This procedure uses a soft tube (catheter) to place metal coils or other devices to block the veins. This cuts off the blood flow to the swollen veins. Follow these instructions at home: Take over-the-counter and prescription medicines only as told by your provider. Wear supportive underwear. Use an athletic supporter when doing sports activities. Contact a health care provider if: Your pain is increasing. You have redness in the affected area. Your testicle is enlarged, swollen, or painful. You have swelling that does not get better when you are lying down. One of your testicles is smaller than the other. You develop swelling in your legs. Get help right away if: You have difficulty breathing. This symptom may be an emergency. Get help right away. Call 911. Do not wait to see if the symptom will go away. Do not drive yourself to the hospital. This information  is not intended to replace advice given to you by your health care provider. Make sure you discuss any questions you have with your health care provider. Document Revised: 12/01/2021 Document Reviewed: 12/01/2021 Elsevier Patient Education  2024 [employer].

## 2023-12-18 NOTE — Progress Notes (Signed)
 "  Subjective:  Patient ID: Joseph Ferrell, male    DOB: 1990-09-20  Age: 33 y.o. MRN: [id number]  CC: Hypertension   HPI [name] presents for f/up ---   Discussed the use of AI scribe software for clinical note transcription with the patient, who gave verbal consent to proceed.  History of Present Illness Joseph Ferrell is a 33 year old male with hypertension who presents with a concern about a testicular lump.  He has noticed a lump in his testicle for the past week, which is causing concern. The lump is not painful under normal circumstances but causes discomfort when the scrotum constricts due to temperature changes. He is unsure of the origin of the lump and feels self-conscious about it. The mass in the testicular area is mobile and its perception varies with body positioning.  No urinary symptoms such as painful, bloody, or frequent urination. No pain or swollen lymph nodes in the pelvis, headache, blurred vision, chest pain, or shortness of breath.  He has a history of hypertension and is currently taking two blood pressure medications along with vitamins B, D, and C. A recent blood pressure reading was 160/110, which he attributes to anxiety and overthinking related to his testicular concern. No symptoms typically associated with high blood pressure such as headache, blurred vision, chest pain, or palpitations.  He occasionally smokes marijuana but has quit drinking alcohol.     Outpatient Medications Prior to Visit  Medication Sig Dispense Refill   albuterol  (VENTOLIN  HFA) 108 (90 Base) MCG/ACT inhaler INHALE 2 PUFFS BY MOUTH EVERY 6 HOURS AS NEEDED FOR WHEEZING OR SHORTNESS OF BREATH 18 g 0   budesonide -formoterol  (SYMBICORT ) 80-4.5 MCG/ACT inhaler Inhale 2 puffs into the lungs 2 (two) times daily. 1 each 3   Cholecalciferol (VITAMIN D3) 50 MCG (2000 UT) TABS Take by mouth daily.     vitamin B-12 (CYANOCOBALAMIN ) 50 MCG tablet Take 50 mcg by mouth daily.      Albuterol -Budesonide  (AIRSUPRA ) 90-80 MCG/ACT AERO Inhale 2 puffs into the lungs 4 (four) times daily as needed. 32.1 g 1   indapamide  (LOZOL ) 1.25 MG tablet Take 1 tablet (1.25 mg total) by mouth daily. 90 tablet 1   valsartan  (DIOVAN ) 160 MG tablet Take 1 tablet (160 mg total) by mouth daily. 90 tablet 1   No facility-administered medications prior to visit.    ROS Review of Systems  Constitutional:  Negative for appetite change, chills, diaphoresis, fatigue and fever.  HENT: Negative.    Respiratory: Negative.  Negative for cough, chest tightness, shortness of breath and wheezing.   Cardiovascular:  Negative for chest pain, palpitations and leg swelling.  Gastrointestinal: Negative.  Negative for abdominal pain, blood in stool, constipation, diarrhea, nausea and vomiting.  Genitourinary:  Positive for scrotal swelling. Negative for decreased urine volume, difficulty urinating, dysuria, frequency, genital sores, penile discharge, penile pain and testicular pain.  Musculoskeletal: Negative.  Negative for arthralgias and myalgias.  Skin: Negative.   Neurological: Negative.  Negative for dizziness, weakness and light-headedness.  Hematological:  Negative for adenopathy. Does not bruise/bleed easily.  Psychiatric/Behavioral:  Positive for confusion. Negative for dysphoric mood, sleep disturbance and suicidal ideas.     Objective:  BP (!) 164/98 (BP Location: Left Arm, Patient Position: Sitting, Cuff Size: Normal) Comment: BP (R) 162/94  Pulse 87   Temp 98.6 F (37 C) (Oral)   Ht 5' 8.5 (1.74 m)   Wt 252 lb (114.3 kg)   SpO2 96%  BMI 37.76 kg/m   BP Readings from Last 3 Encounters:  12/18/23 (!) 164/98  02/09/23 138/82  02/07/22 (!) 140/88    Wt Readings from Last 3 Encounters:  12/18/23 252 lb (114.3 kg)  02/09/23 245 lb 12.8 oz (111.5 kg)  02/07/22 251 lb (113.9 kg)    Physical Exam Vitals reviewed. Exam conducted with a chaperone present Art Gallery Manager).  HENT:     Nose: Nose  normal.     Mouth/Throat:     Mouth: Mucous membranes are moist.  Eyes:     General: No scleral icterus.    Conjunctiva/sclera: Conjunctivae normal.  Cardiovascular:     Rate and Rhythm: Normal rate and regular rhythm.     Heart sounds: No murmur heard.    No friction rub. No gallop.     Comments: EKG-- NSR, 69 bpm Moderate LVH No Q waves Unchanged  Pulmonary:     Effort: Pulmonary effort is normal.     Breath sounds: No stridor. No wheezing, rhonchi or rales.  Abdominal:     General: Abdomen is flat.     Palpations: There is no mass.     Tenderness: There is no abdominal tenderness. There is no guarding.     Hernia: No hernia is present. There is no hernia in the left inguinal area or right inguinal area.  Genitourinary:    Pubic Area: No rash.      Penis: Normal and circumcised.      Testes:        Right: Varicocele present. Mass, tenderness, swelling or testicular hydrocele not present. Right testis is descended.        Left: Varicocele present. Mass, tenderness, swelling or testicular hydrocele not present. Left testis is descended.     Epididymis:     Right: Not inflamed or enlarged. No mass or tenderness.     Left: Not inflamed or enlarged. No mass or tenderness.  Musculoskeletal:        General: Normal range of motion.     Cervical back: Neck supple.     Right lower leg: No edema.     Left lower leg: No edema.  Lymphadenopathy:     Cervical: No cervical adenopathy.     Lower Body: No right inguinal adenopathy. No left inguinal adenopathy.  Skin:    General: Skin is warm and dry.  Neurological:     General: No focal deficit present.     Mental Status: Mental status is at baseline.  Psychiatric:        Attention and Perception: He is inattentive.        Speech: Speech is delayed and tangential.        Behavior: Behavior normal. Behavior is cooperative.        Thought Content: Thought content is paranoid. Thought content is not delusional. Thought content does  not include homicidal or suicidal ideation.        Cognition and Memory: Cognition is impaired. Memory is impaired.     Lab Results  Component Value Date   WBC 6.0 12/18/2023   HGB 15.9 12/18/2023   HCT 47.0 12/18/2023   PLT 194.0 12/18/2023   GLUCOSE 85 12/18/2023   CHOL 164 02/09/2023   TRIG 108.0 02/09/2023   HDL 42.40 02/09/2023   LDLDIRECT 102.0 02/07/2022   LDLCALC 100 (H) 02/09/2023   ALT 44 12/18/2023   AST 21 12/18/2023   NA 135 12/18/2023   K 3.7 12/18/2023   CL 99 12/18/2023   CREATININE  1.01 12/18/2023   BUN 10 12/18/2023   CO2 27 12/18/2023   TSH 2.24 12/18/2023   INR 1.1 (H) 12/18/2023   HGBA1C 5.4 02/07/2022   Fibrosis 4 Score = .51  Fib-4 interpretation is not validated for people under 35 or over 44 years of age. However, scores under 2.0 are generally considered low risk.   Assessment & Plan:  Elevated LFTs -     Hepatic function panel; Future -     Hepatitis B surface antibody,quantitative; Future -     Hepatitis B surface antigen; Future -     Hepatitis C antibody; Future -     Hepatitis A antibody, total; Future -     Hepatitis B surface antibody,qualitative; Future -     Hepatitis B core antibody, total; Future -     Protime-INR; Future  Essential hypertension, benign- Will try to achieve better blood pressure control. -     Basic metabolic panel with GFR; Future -     CBC with Differential/Platelet; Future -     TSH; Future -     Urinalysis, Routine w reflex microscopic; Future -     DRUG MONITORING, PANEL 7 WITH CONFIRMATION, URINE; Future -     EKG 12-Lead -     Triamterene -HCTZ; Take 1 each (1 capsule total) by mouth daily.  Dispense: 90 capsule; Refill: 0 -     amLODIPine  Besylate; Take 1 tablet (5 mg total) by mouth daily.  Dispense: 90 tablet; Refill: 0 -     AMB Referral VBCI Care Management  LVH (left ventricular hypertrophy) -     Urinalysis, Routine w reflex microscopic; Future -     DRUG MONITORING, PANEL 7 WITH CONFIRMATION,  URINE; Future  Left varicocele -     Ambulatory referral to Urology  Moderate tetrahydrocannabinol (THC) dependence (HCC) -     AMB Referral VBCI Care Management  Mild intermittent asthma without complication -     Airsupra ; Inhale 2 puffs into the lungs 4 (four) times daily as needed.  Dispense: 32.1 g; Refill: 1  Other orders -     DM TEMPLATE     Follow-up: Return in about 3 months (around 03/18/2024).  Debby Molt, MD "

## 2023-12-19 LAB — TSH: TSH: 2.24 u[IU]/mL (ref 0.35–5.50)

## 2023-12-19 MED ORDER — TRIAMTERENE-HCTZ 37.5-25 MG PO CAPS: 1.0000 | ORAL_CAPSULE | Freq: Every day | ORAL | 0 refills | Status: DC

## 2023-12-19 MED ORDER — AMLODIPINE BESYLATE 5 MG PO TABS: 5.0000 mg | ORAL_TABLET | Freq: Every day | ORAL | 0 refills | Status: DC

## 2023-12-19 MED ORDER — AIRSUPRA 90-80 MCG/ACT IN AERO
2.0000 | INHALATION_SPRAY | Freq: Four times a day (QID) | RESPIRATORY_TRACT | 1 refills | Status: AC | PRN
  Filled 2024-01-23: qty 10.7, 25d supply, fill #0

## 2023-12-21 ENCOUNTER — Telehealth: Payer: Self-pay

## 2023-12-21 ENCOUNTER — Ambulatory Visit: Payer: Self-pay | Admitting: Internal Medicine

## 2023-12-21 LAB — HEPATITIS C ANTIBODY: Hepatitis C Ab: NONREACTIVE

## 2023-12-21 LAB — DRUG MONITORING, PANEL 7 WITH CONFIRMATION, URINE
6 Acetylmorphine: NEGATIVE ng/mL (ref <10)
Alcohol Metabolites: NEGATIVE ng/mL (ref <500)
Amphetamines: NEGATIVE ng/mL (ref <500)
Barbiturates: NEGATIVE ng/mL (ref <300)
Benzodiazepines: NEGATIVE ng/mL (ref <100)
Cocaine Metabolite: NEGATIVE ng/mL (ref <150)
Creatinine: 92.8 mg/dL (ref >=20.0)
Marijuana Metabolite: 134 ng/mL — ABNORMAL HIGH (ref <5)
Marijuana Metabolite: POSITIVE ng/mL — AB (ref <20)
Methadone Metabolite: NEGATIVE ng/mL (ref <100)
Opiates: NEGATIVE ng/mL (ref <100)
Oxidant: NEGATIVE ug/mL (ref <200)
Oxycodone: NEGATIVE ng/mL (ref <100)
pH: 6.7 (ref 4.5–9.0)

## 2023-12-21 LAB — HEPATITIS B CORE ANTIBODY, TOTAL: Hep B Core Total Ab: NONREACTIVE

## 2023-12-21 LAB — HEPATITIS A ANTIBODY, TOTAL: Hepatitis A AB,Total: NONREACTIVE

## 2023-12-21 LAB — HEPATITIS B SURFACE ANTIBODY, QUANTITATIVE: Hep B S AB Quant (Post): <5 m[IU]/mL — ABNORMAL LOW (ref >=10)

## 2023-12-21 LAB — DM TEMPLATE

## 2023-12-21 LAB — HEPATITIS B SURFACE ANTIGEN: Hepatitis B Surface Ag: NONREACTIVE

## 2023-12-21 NOTE — Telephone Encounter (Signed)
 Patients mother has been made aware of the medication changes and gave a verbal understanding.

## 2023-12-21 NOTE — Telephone Encounter (Signed)
 Copied from CRM #[id number]. Topic: Clinical - Medication Question >> Dec 21, 2023 11:17 AM Ahlexyia S wrote: Reason for CRM: Pt mom Ronal called in to confirm if pt is just supposed to take the new blood pressure medicine (triamterene-hydrochlorothiazide  (DYAZIDE) 37.5-25 MG capsule) and (amLODipine (NORVASC) 5 MG tablet) that he was switched to or is he taking both the old and new meds together. Ronal also has some questions about the blood work that pt previously had as well.

## 2023-12-22 ENCOUNTER — Telehealth: Payer: Self-pay

## 2023-12-22 ENCOUNTER — Other Ambulatory Visit (HOSPITAL_COMMUNITY): Payer: Self-pay

## 2023-12-22 ENCOUNTER — Telehealth: Payer: Self-pay | Admitting: *Deleted

## 2023-12-22 NOTE — Telephone Encounter (Signed)
 Pharmacy Patient Advocate Encounter   Received notification from Onbase that prior authorization for Airsupra  90-80MCG/ACT aerosol is required/requested.   Insurance verification completed.   The patient is insured through [facility]   Per test claim: PA required; PA started via CoverMyMeds. KEY [id number] . Waiting for clinical questions to populate.

## 2023-12-22 NOTE — Progress Notes (Signed)
 Complex Care Management Note  Care Guide Note 12/22/2023 Name: Joseph Ferrell MRN: [id number] DOB: July 09, 1990  Joseph Ferrell is a 33 y.o. year old male who sees Joshua Debby CROME, MD for primary care. I reached out to Joseph Ferrell by phone today to offer complex care management services.  Mr. Heal was given information about Complex Care Management services today including:   The Complex Care Management services include support from the care team which includes your Nurse Care Manager, Clinical Social Worker, or Pharmacist.  The Complex Care Management team is here to help remove barriers to the health concerns and goals most important to you. Complex Care Management services are voluntary, and the patient may decline or stop services at any time by request to their care team member.   Complex Care Management Consent Status: Patient agreed to services and verbal consent obtained.   Follow up plan:  Telephone appointment with complex care management team member scheduled for:  01/04/2024  Encounter Outcome:  Patient Scheduled with pharmacist - declined Licensed Clinical SW   Thedford Franks, CMA [facility]  [facility], [facility] Guide Direct Dial: [phone]  Fax: [phone] Website: [email or web address]

## 2023-12-22 NOTE — Telephone Encounter (Signed)
 Clinical questions have been answered and PA submitted. PA currently Pending.

## 2023-12-22 NOTE — Progress Notes (Signed)
 Complex Care Management Note Care Guide Note  12/22/2023 Name: Joseph Ferrell MRN: [id number] DOB: February 23, 1991   Complex Care Management Outreach Attempts: An unsuccessful telephone outreach was attempted today to offer the patient information about available complex care management services.  Follow Up Plan:  Additional outreach attempts will be made to offer the patient complex care management information and services.   Encounter Outcome:  No Answer  Thedford Franks, CMA [place]  [facility], [facility] Guide Direct Dial: [phone]  Fax: [phone] Website: [email or web address]

## 2023-12-22 NOTE — Telephone Encounter (Signed)
 Patients mom has been made aware and gave a verbal understanding.

## 2023-12-22 NOTE — Telephone Encounter (Signed)
 Pharmacy Patient Advocate Encounter  Received notification from [employer] that Prior Authorization for Airsupra  90-80MCG/ACT aerosol has been APPROVED from 12/22/23 to 12/21/24. Ran test claim, Copay is $4. This test claim was processed through [facility]- copay amounts may vary at other pharmacies due to pharmacy/plan contracts, or as the patient moves through the different stages of their insurance plan.   PA #/Case ID/Reference #: [id number]

## 2024-01-04 ENCOUNTER — Other Ambulatory Visit

## 2024-01-11 ENCOUNTER — Telehealth: Payer: Self-pay | Admitting: *Deleted

## 2024-01-11 NOTE — Progress Notes (Signed)
 Complex Care Management Care Guide Note  01/11/2024 Name: Joseph Ferrell MRN: [id number] DOB: 1990-08-23  Joseph Ferrell is a 33 y.o. year old male who is a primary care patient of Joshua Debby CROME, MD and is actively engaged with the care management team. I reached out to Joseph Ferrell by phone today to assist with re-scheduling  with the Pharmacist.  Follow up plan: Unsuccessful telephone outreach attempt made. A HIPAA compliant phone message was left for the patient providing contact information and requesting a return call.  Thedford Franks, CMA [place]  [facility], [facility] Guide Direct Dial: [phone]  Fax: [phone] Website: [email or web address]

## 2024-01-11 NOTE — Progress Notes (Signed)
 Complex Care Management Care Guide Note  01/11/2024 Name: Joseph Ferrell MRN: [id number] DOB: 12-Apr-1990  Joseph Ferrell is a 33 y.o. year old male who is a primary care patient of Joshua Debby CROME, MD and is actively engaged with the care management team. I reached out to Joseph Ferrell by phone today to assist with re-scheduling  with the Pharmacist.  Follow up plan: Telephone appointment with complex care management team member scheduled for:  01/15/2024  Thedford Franks, CMA [place]  [facility], [facility] Guide Direct Dial: [phone]  Fax: [phone] Website: [email or web address]

## 2024-01-15 ENCOUNTER — Other Ambulatory Visit: Admitting: Pharmacist

## 2024-01-15 DIAGNOSIS — I10 Essential (primary) hypertension: Secondary | ICD-10-CM

## 2024-01-15 NOTE — Patient Instructions (Signed)
 It was a pleasure speaking with you today!  Continue your current regimen.  Monitor blood pressure at home to make sure it is staying below goal of less than 130/80, on average.  Feel free to call with any questions or concerns!  Darrelyn Drum, PharmD, BCPS, CPP Clinical Pharmacist Practitioner [facility] [phone]

## 2024-01-15 NOTE — Progress Notes (Signed)
   01/15/2024 Name: Joseph Ferrell MRN: [id number] DOB: September 29, 1990  Chief Complaint  Patient presents with   Hypertension   Medication Management    TIONNE Ferrell is a 33 y.o. year old male who presented for a telephone visit.   They were referred to the pharmacist by their PCP for assistance in managing hypertension.   Subjective:  Care Team: Primary Care Provider: Joshua Debby CROME, MD ; Next Scheduled Visit: 02/12/24   Medication Access/Adherence  Current Pharmacy:  [facility] [address] [place] - [address] [place] [address] Phone: [phone] Fax: [phone]   Patient reports affordability concerns with their medications: No  Patient reports access/transportation concerns to their pharmacy: No  Patient reports adherence concerns with their medications:  No     Hypertension:  Current medications: amlodipine 5 mg daily, triamterene/hydrochlorothiazide  37/5/25 mg daily Medications previously tried: valsartan , indapamide  (d/c due to elevated BP)  Patient has a validated, automated, upper arm home BP cuff Current blood pressure readings readings: 126/85 on 10/25  Patient denies hypotensive s/sx including dizziness, lightheadedness.     Objective:  Lab Results  Component Value Date   HGBA1C 5.4 02/07/2022    Lab Results  Component Value Date   CREATININE 1.01 12/18/2023   BUN 10 12/18/2023   NA 135 12/18/2023   K 3.7 12/18/2023   CL 99 12/18/2023   CO2 27 12/18/2023    Lab Results  Component Value Date   CHOL 164 02/09/2023   HDL 42.40 02/09/2023   LDLCALC 100 (H) 02/09/2023   LDLDIRECT 102.0 02/07/2022   TRIG 108.0 02/09/2023   CHOLHDL 4 02/09/2023    Medications Reviewed Today     Reviewed by Merceda Lela JONELLE, RPH (Pharmacist) on 01/15/24 at 1440  Med List Status: <None>   Medication Order Taking? Sig Documenting Provider Last Dose Status Informant  albuterol  (VENTOLIN  HFA) 108 (90  Base) MCG/ACT inhaler [id number]  INHALE 2 PUFFS BY MOUTH EVERY 6 HOURS AS NEEDED FOR WHEEZING OR SHORTNESS OF SHERIDA Joshua Debby CROME, MD  Active   Albuterol -Budesonide  (AIRSUPRA ) 90-80 MCG/ACT AERO [id number]  Inhale 2 puffs into the lungs 4 (four) times daily as needed. Joshua Debby CROME, MD  Active   amLODipine (NORVASC) 5 MG tablet [id number] Yes Take 1 tablet (5 mg total) by mouth daily. Joshua Debby CROME, MD  Active   budesonide -formoterol  (SYMBICORT ) 80-4.5 MCG/ACT inhaler [id number]  Inhale 2 puffs into the lungs 2 (two) times daily. Joshua Debby CROME, MD  Active   Cholecalciferol (VITAMIN D3) 50 MCG (2000 UT) TABS [id number]  Take by mouth daily. [provider]  Active Self  triamterene-hydrochlorothiazide  (DYAZIDE) 37.5-25 MG capsule [id number] Yes Take 1 each (1 capsule total) by mouth daily. Joshua Debby CROME, MD  Active   vitamin B-12 (CYANOCOBALAMIN ) 50 MCG tablet [id number]  Take 50 mcg by mouth daily. [provider]  Active Self              Assessment/Plan:   Hypertension: - Currently borderline controlled, BP goal <130/80. BP much improved since change in medications. Encouraged pt to continue monitoring BP at home to ensure it is below goal on average. - Reviewed long term cardiovascular and renal outcomes of uncontrolled blood pressure - Recommend to continue current regimen     Follow Up Plan: PCP f/u on 11/24  Darrelyn Merceda, PharmD, BCPS, CPP Clinical Pharmacist Practitioner [facility] [phone]

## 2024-01-18 ENCOUNTER — Ambulatory Visit: Admitting: Emergency Medicine

## 2024-01-18 ENCOUNTER — Ambulatory Visit: Payer: Self-pay

## 2024-01-18 ENCOUNTER — Ambulatory Visit: Payer: Self-pay | Admitting: Emergency Medicine

## 2024-01-18 ENCOUNTER — Ambulatory Visit

## 2024-01-18 ENCOUNTER — Encounter: Payer: Self-pay | Admitting: Emergency Medicine

## 2024-01-18 ENCOUNTER — Telehealth: Payer: Self-pay

## 2024-01-18 VITALS — BP 126/82 | HR 65 | Temp 98.2°F | Ht 68.5 in | Wt 246.0 lb

## 2024-01-18 DIAGNOSIS — R062 Wheezing: Secondary | ICD-10-CM | POA: Diagnosis not present

## 2024-01-18 DIAGNOSIS — J4541 Moderate persistent asthma with (acute) exacerbation: Secondary | ICD-10-CM

## 2024-01-18 DIAGNOSIS — F17200 Nicotine dependence, unspecified, uncomplicated: Secondary | ICD-10-CM | POA: Diagnosis not present

## 2024-01-18 DIAGNOSIS — Z23 Encounter for immunization: Secondary | ICD-10-CM

## 2024-01-18 DIAGNOSIS — R059 Cough, unspecified: Secondary | ICD-10-CM | POA: Diagnosis not present

## 2024-01-18 MED ORDER — TRELEGY ELLIPTA 100-62.5-25 MCG/ACT IN AEPB: 1.0000 | INHALATION_SPRAY | Freq: Every day | RESPIRATORY_TRACT | 11 refills | Status: DC

## 2024-01-18 NOTE — Telephone Encounter (Signed)
 Copied from CRM [id number]. Topic: Clinical - Prescription Issue >> Jan 18, 2024  3:10 PM Nessti S wrote: Reason for CRM: pt prescription Fluticasone -Umeclidin-Vilant (TRELEGY ELLIPTA ) 100-62.5-25 MCG/ACT AEPB needs pcp preauthorization.

## 2024-01-18 NOTE — Assessment & Plan Note (Signed)
Cardiovascular/cancer risks associated with smoking discussed. Smoking cessation advice given.

## 2024-01-18 NOTE — Assessment & Plan Note (Signed)
 Clinically stable.  No red flag signs or symptoms. Uncontrolled asthma.  On no maintenance therapy Using rescue albuterol  inhaler frequently over the past several days Some wheezing and rhonchi on physical exam Continues to smoke although he states he stopped 2 weeks ago Recommend chest x-ray today No signs of lower respiratory infection.  Still having clear sputum. Recommend to start Trelegy once a day.  Continue albuterol  rescue inhaler as needed Advised to stay well-hydrated ED precautions given Advised to follow-up with PCP within the next couple weeks Advised to contact the office if no better or worse during the next several days.

## 2024-01-18 NOTE — Telephone Encounter (Unsigned)
 Copied from CRM [id number]. Topic: Clinical - Medication Prior Auth >> Jan 18, 2024  3:51 PM Taleah C wrote: Reason for CRM: Juan from [employer] called for a PA for Fluticasone -Umeclidin-Vilant (TRELEGY ELLIPTA ).  Callback [id number]

## 2024-01-18 NOTE — Patient Instructions (Signed)
 Asthma, Adult  Asthma is a condition that causes swelling and narrowing of the airways. These are the passages that lead from the nose and mouth down into the lungs. When asthma symptoms get worse it is called an asthma attack or flare. This can make it hard to breathe. Asthma flares can range from minor to life-threatening. There is no cure for asthma, but medicines and lifestyle changes can help to control it. What are the causes? It is not known exactly what causes asthma, but certain things can cause asthma symptoms to get worse (triggers). What can trigger an asthma attack? Cigarette smoke. Mold. Dust. Your pet's skin flakes (dander). Cockroaches. Pollen. Air pollution (like household cleaners, wood smoke, smog, or Therapist, occupational). What are the signs or symptoms? Trouble breathing (shortness of breath). Coughing. Making high-pitched whistling sounds when you breathe, most often when you breathe out (wheezing). Chest tightness. Tiredness with little activity. Poor exercise tolerance. How is this treated? Controller medicines that help prevent asthma symptoms. Fast-acting reliever or rescue medicines. These give short-term relief of asthma symptoms. Allergy medicines if your attacks are brought on by allergens. Medicines to help control the body's defense (immune) system. Staying away from the things that cause asthma attacks. Follow these instructions at home: Avoiding triggers in your home Do not allow anyone to smoke in your home. Limit use of fireplaces and wood stoves. Get rid of pests (such as roaches and mice) and their droppings. Keep your home clean. Clean your floors. Dust regularly. Use cleaning products that do not smell. Wash bed sheets and blankets every week in hot water. Dry them in a dryer. Have someone vacuum when you are not home. Change your heating and air conditioning filters often. Use blankets that are made of polyester or cotton. General  instructions Take over-the-counter and prescription medicines only as told by your doctor. Do not smoke or use any products that contain nicotine or tobacco. If you need help quitting, ask your doctor. Stay away from secondhand smoke. Avoid doing things outdoors when allergen counts are high and when air quality is low. Warm up before you exercise. Take time to cool down after exercise. Use a peak flow meter as told by your doctor. A peak flow meter is a tool that measures how well your lungs are working. Keep track of the peak flow meter's readings. Write them down. Follow your asthma action plan. This is a written plan for taking care of your asthma and treating your attacks. Make sure you get all the shots (vaccines) that your doctor recommends. Ask your doctor about a flu shot and a pneumonia shot. Keep all follow-up visits. Contact a doctor if: You have wheezing, shortness of breath, or a cough even while taking medicine to prevent attacks. The mucus you cough up (sputum) is thicker than usual. The mucus you cough up changes from clear or white to yellow, green, gray, or is bloody. You have problems from the medicine you are taking, such as: A rash. Itching. Swelling. Trouble breathing. You need reliever medicines more than 2-3 times a week. Your peak flow reading is still at 50-79% of your personal best after following the action plan for 1 hour. You have a fever. Get help right away if: You seem to be worse and are not responding to medicine during an asthma attack. You are short of breath even at rest. You get short of breath when doing very little activity. You have trouble eating, drinking, or talking. You have chest  pain or tightness. You have a fast heartbeat. Your lips or fingernails start to turn blue. You are light-headed or dizzy, or you faint. Your peak flow is less than 50% of your personal best. You feel too tired to breathe normally. These symptoms may be an  emergency. Get help right away. Call 911. Do not wait to see if the symptoms will go away. Do not drive yourself to the hospital. Summary Asthma is a long-term (chronic) condition in which the airways get tight and narrow. An asthma attack can make it hard to breathe. Asthma cannot be cured, but medicines and lifestyle changes can help control it. Make sure you understand how to avoid triggers and how and when to use your medicines. Avoid things that can cause allergy symptoms (allergens). These include animal skin flakes (dander) and pollen from trees or grass. Avoid things that pollute the air. These may include household cleaners, wood smoke, smog, or chemical odors. This information is not intended to replace advice given to you by your health care provider. Make sure you discuss any questions you have with your health care provider. Document Revised: 12/14/2020 Document Reviewed: 12/14/2020 Elsevier Patient Education  2024 [employer].

## 2024-01-18 NOTE — Telephone Encounter (Signed)
 FYI Only or Action Required?: FYI only for provider: appointment scheduled on today.  Patient was last seen in primary care on 12/18/2023 by Joshua Debby CROME, MD.  Called Nurse Triage reporting Shortness of Breath.  Symptoms began today.  Symptoms are: unchanged.  Triage Disposition: See HCP Within 4 Hours (Or PCP Triage)  Patient/caregiver understands and will follow disposition?: Yes      Copied from CRM [id number]. Topic: Clinical - Red Word Triage >> Jan 18, 2024 12:18 PM Viola F wrote: Red Word that prompted transfer to Nurse Triage: Patient having shortness of breath and wheezing for about a week, requesting appt. Mother Ronal on the phone       Reason for Disposition  [1] MILD difficulty breathing (e.g., minimal/no SOB at rest, SOB with walking, pulse < 100) AND [2] NEW-onset or WORSE than normal  Answer Assessment - Initial Assessment Questions 1. RESPIRATORY STATUS: Describe your breathing? (e.g., wheezing, shortness of breath, unable to speak, severe coughing)      Shortness of breath and wheezing  2. ONSET: When did this breathing problem begin?      Today, similar last week 3. PATTERN Does the difficult breathing come and go, or has it been constant since it started?      Constant  4. SEVERITY: How bad is your breathing? (e.g., mild, moderate, severe)      Mild  5. RECURRENT SYMPTOM: Have you had difficulty breathing before? If Yes, ask: When was the last time? and What happened that time?      Yes, history of asthma  6. CARDIAC HISTORY: Do you have any history of heart disease? (e.g., heart attack, angina, bypass surgery, angioplasty)      No 7. LUNG HISTORY: Do you have any history of lung disease?  (e.g., pulmonary embolus, asthma, emphysema)     Asthma  8. CAUSE: What do you think is causing the breathing problem?      Unsure  9. OTHER SYMPTOMS: Do you have any other symptoms? (e.g., chest pain, cough, dizziness, fever, runny nose)      She reports the patient has mucous buildup 10. O2 SATURATION MONITOR:  Do you use an oxygen saturation monitor (pulse oximeter) at home? If Yes, ask: What is your reading (oxygen level) today? What is your usual oxygen saturation reading? (e.g., 95%)       No  Protocols used: Breathing Difficulty-A-AH

## 2024-01-18 NOTE — Addendum Note (Signed)
 Addended by: ROSALVA LEX RAMAN on: 01/18/2024 01:52 PM   Modules accepted: Orders

## 2024-01-18 NOTE — Progress Notes (Signed)
 Joseph Ferrell 33 y.o.   Chief Complaint  Patient presents with   Shortness of Breath     This has been going on for about a couple of weeks, pt has tried OTC medication and nothing seems to be helping     HISTORY OF PRESENT ILLNESS: Acute problem visit today.  Patient of Dr. Debby Molt This is a 33 y.o. male with history of asthma, continues to smoke, complaining of shortness of breath and wheezing on and off for the past couple weeks Has been using albuterol  rescue inhaler several times a day for the past 4 to 5 days Denies fever or chills.  Denies flulike symptoms. No other associated symptoms No other complaints or medical concerns today.  Shortness of Breath Associated symptoms include wheezing. Pertinent negatives include no abdominal pain, chest pain, fever, headaches, rash, sore throat or vomiting.     Prior to Admission medications   Medication Sig Start Date End Date Taking? Authorizing Provider  albuterol  (VENTOLIN  HFA) 108 (90 Base) MCG/ACT inhaler INHALE 2 PUFFS BY MOUTH EVERY 6 HOURS AS NEEDED FOR WHEEZING OR SHORTNESS OF BREATH 02/09/23   Molt Debby CROME, MD  Albuterol -Budesonide  (AIRSUPRA ) 90-80 MCG/ACT AERO Inhale 2 puffs into the lungs 4 (four) times daily as needed. 12/19/23   Molt Debby CROME, MD  amLODipine (NORVASC) 5 MG tablet Take 1 tablet (5 mg total) by mouth daily. 12/19/23   Molt Debby CROME, MD  budesonide -formoterol  (SYMBICORT ) 80-4.5 MCG/ACT inhaler Inhale 2 puffs into the lungs 2 (two) times daily. 06/10/23   Molt Debby CROME, MD  Cholecalciferol (VITAMIN D3) 50 MCG (2000 UT) TABS Take by mouth daily.    [provider]  triamterene-hydrochlorothiazide  (DYAZIDE) 37.5-25 MG capsule Take 1 each (1 capsule total) by mouth daily. 12/19/23   Molt Debby CROME, MD  vitamin B-12 (CYANOCOBALAMIN ) 50 MCG tablet Take 50 mcg by mouth daily.    [provider]    No Known Allergies  Patient Active Problem List   Diagnosis Date Noted   Elevated  LFTs 12/18/2023   Left varicocele 12/18/2023   Moderate tetrahydrocannabinol (THC) dependence (HCC) 12/18/2023   Pure hyperglyceridemia 02/09/2023   Smoker 02/10/2022   Vitamin D  deficiency 02/10/2022   B12 deficiency 02/10/2022   Asthma, intermittent 03/20/2013   Essential hypertension, benign 03/20/2013   LVH (left ventricular hypertrophy) 03/20/2013    Past Medical History:  Diagnosis Date   Allergy    Asthma     History reviewed. No pertinent surgical history.  Social History   Socioeconomic History   Marital status: Single    Spouse name: Not on file   Number of children: Not on file   Years of education: Not on file   Highest education level: Not on file  Occupational History   Not on file  Tobacco Use   Smoking status: Some Days    Types: Cigars   Smokeless tobacco: Never   Tobacco comments:    uses electronic cig with vapor  Vaping Use   Vaping status: Never Used  Substance and Sexual Activity   Alcohol use: Yes    Alcohol/week: 15.0 standard drinks of alcohol    Types: 15 Standard drinks or equivalent per week   Drug use: Yes    Types: Marijuana   Sexual activity: Yes  Other Topics Concern   Not on file  Social History Narrative   Not on file   Social Drivers of Health   Financial Resource Strain: Not on file  Food  Insecurity: Not on file  Transportation Needs: Not on file  Physical Activity: Not on file  Stress: Not on file  Social Connections: Not on file  Intimate Partner Violence: Not on file    Family History  Problem Relation Age of Onset   Hypertension Mother    Alcohol abuse Father    Hypertension Father      Review of Systems  Constitutional: Negative.  Negative for chills and fever.  HENT: Negative.  Negative for congestion and sore throat.   Respiratory:  Positive for shortness of breath and wheezing.   Cardiovascular: Negative.  Negative for chest pain and palpitations.  Gastrointestinal:  Negative for abdominal pain,  diarrhea, nausea and vomiting.  Genitourinary: Negative.   Skin: Negative.  Negative for rash.  Neurological: Negative.  Negative for dizziness and headaches.    Today's Vitals   01/18/24 1320  BP: 126/82  Pulse: 65  Temp: 98.2 F (36.8 C)  TempSrc: Oral  SpO2: 98%  Weight: 246 lb (111.6 kg)  Height: 5' 8.5 (1.74 m)   Body mass index is 36.86 kg/m.   Physical Exam Constitutional:      Appearance: Normal appearance.  HENT:     Head: Normocephalic.     Mouth/Throat:     Mouth: Mucous membranes are moist.     Pharynx: Oropharynx is clear.  Eyes:     Extraocular Movements: Extraocular movements intact.     Conjunctiva/sclera: Conjunctivae normal.     Pupils: Pupils are equal, round, and reactive to light.  Cardiovascular:     Rate and Rhythm: Normal rate.     Pulses: Normal pulses.     Heart sounds: Normal heart sounds.  Pulmonary:     Effort: Pulmonary effort is normal.     Breath sounds: Wheezing and rhonchi present. No rales.  Abdominal:     Palpations: Abdomen is soft.     Tenderness: There is no abdominal tenderness.  Skin:    General: Skin is warm and dry.     Capillary Refill: Capillary refill takes less than 2 seconds.  Neurological:     Mental Status: He is alert and oriented to person, place, and time.  Psychiatric:        Mood and Affect: Mood normal.        Behavior: Behavior normal.      ASSESSMENT & PLAN: Problem List Items Addressed This Visit       Respiratory   Moderate persistent asthma with acute exacerbation - Primary   Clinically stable.  No red flag signs or symptoms. Uncontrolled asthma.  On no maintenance therapy Using rescue albuterol  inhaler frequently over the past several days Some wheezing and rhonchi on physical exam Continues to smoke although he states he stopped 2 weeks ago Recommend chest x-ray today No signs of lower respiratory infection.  Still having clear sputum. Recommend to start Trelegy once a day.  Continue  albuterol  rescue inhaler as needed Advised to stay well-hydrated ED precautions given Advised to follow-up with PCP within the next couple weeks Advised to contact the office if no better or worse during the next several days.      Relevant Medications   Fluticasone -Umeclidin-Vilant (TRELEGY ELLIPTA ) 100-62.5-25 MCG/ACT AEPB   Other Relevant Orders   DG Chest 2 View     Other   Smoker   Cardiovascular/cancer risks associated with smoking discussed. Smoking cessation advice given.      Patient Instructions  Asthma, Adult  Asthma is a condition that causes  swelling and narrowing of the airways. These are the passages that lead from the nose and mouth down into the lungs. When asthma symptoms get worse it is called an asthma attack or flare. This can make it hard to breathe. Asthma flares can range from minor to life-threatening. There is no cure for asthma, but medicines and lifestyle changes can help to control it. What are the causes? It is not known exactly what causes asthma, but certain things can cause asthma symptoms to get worse (triggers). What can trigger an asthma attack? Cigarette smoke. Mold. Dust. Your pet's skin flakes (dander). Cockroaches. Pollen. Air pollution (like household cleaners, wood smoke, smog, or therapist, occupational). What are the signs or symptoms? Trouble breathing (shortness of breath). Coughing. Making high-pitched whistling sounds when you breathe, most often when you breathe out (wheezing). Chest tightness. Tiredness with little activity. Poor exercise tolerance. How is this treated? Controller medicines that help prevent asthma symptoms. Fast-acting reliever or rescue medicines. These give short-term relief of asthma symptoms. Allergy medicines if your attacks are brought on by allergens. Medicines to help control the body's defense (immune) system. Staying away from the things that cause asthma attacks. Follow these instructions at  home: Avoiding triggers in your home Do not allow anyone to smoke in your home. Limit use of fireplaces and wood stoves. Get rid of pests (such as roaches and mice) and their droppings. Keep your home clean. Clean your floors. Dust regularly. Use cleaning products that do not smell. Wash bed sheets and blankets every week in hot water. Dry them in a dryer. Have someone vacuum when you are not home. Change your heating and air conditioning filters often. Use blankets that are made of polyester or cotton. General instructions Take over-the-counter and prescription medicines only as told by your doctor. Do not smoke or use any products that contain nicotine or tobacco. If you need help quitting, ask your doctor. Stay away from secondhand smoke. Avoid doing things outdoors when allergen counts are high and when air quality is low. Warm up before you exercise. Take time to cool down after exercise. Use a peak flow meter as told by your doctor. A peak flow meter is a tool that measures how well your lungs are working. Keep track of the peak flow meter's readings. Write them down. Follow your asthma action plan. This is a written plan for taking care of your asthma and treating your attacks. Make sure you get all the shots (vaccines) that your doctor recommends. Ask your doctor about a flu shot and a pneumonia shot. Keep all follow-up visits. Contact a doctor if: You have wheezing, shortness of breath, or a cough even while taking medicine to prevent attacks. The mucus you cough up (sputum) is thicker than usual. The mucus you cough up changes from clear or white to yellow, green, gray, or is bloody. You have problems from the medicine you are taking, such as: A rash. Itching. Swelling. Trouble breathing. You need reliever medicines more than 2-3 times a week. Your peak flow reading is still at 50-79% of your personal best after following the action plan for 1 hour. You have a fever. Get  help right away if: You seem to be worse and are not responding to medicine during an asthma attack. You are short of breath even at rest. You get short of breath when doing very little activity. You have trouble eating, drinking, or talking. You have chest pain or tightness. You have a fast heartbeat.  Your lips or fingernails start to turn blue. You are light-headed or dizzy, or you faint. Your peak flow is less than 50% of your personal best. You feel too tired to breathe normally. These symptoms may be an emergency. Get help right away. Call 911. Do not wait to see if the symptoms will go away. Do not drive yourself to the hospital. Summary Asthma is a long-term (chronic) condition in which the airways get tight and narrow. An asthma attack can make it hard to breathe. Asthma cannot be cured, but medicines and lifestyle changes can help control it. Make sure you understand how to avoid triggers and how and when to use your medicines. Avoid things that can cause allergy symptoms (allergens). These include animal skin flakes (dander) and pollen from trees or grass. Avoid things that pollute the air. These may include household cleaners, wood smoke, smog, or chemical odors. This information is not intended to replace advice given to you by your health care provider. Make sure you discuss any questions you have with your health care provider. Document Revised: 12/14/2020 Document Reviewed: 12/14/2020 Elsevier Patient Education  2024 [employer]    Emil Schaumann, MD [facility]

## 2024-01-19 ENCOUNTER — Other Ambulatory Visit (HOSPITAL_COMMUNITY): Payer: Self-pay

## 2024-01-19 ENCOUNTER — Telehealth: Payer: Self-pay

## 2024-01-19 ENCOUNTER — Ambulatory Visit: Payer: Self-pay

## 2024-01-19 ENCOUNTER — Other Ambulatory Visit: Payer: Self-pay

## 2024-01-19 ENCOUNTER — Emergency Department (HOSPITAL_COMMUNITY): Admission: EM | Admit: 2024-01-19 | Discharge: 2024-01-19 | Disposition: A | Discharge status: Home / Self Care

## 2024-01-19 DIAGNOSIS — J45909 Unspecified asthma, uncomplicated: Secondary | ICD-10-CM | POA: Diagnosis not present

## 2024-01-19 DIAGNOSIS — R0602 Shortness of breath: Secondary | ICD-10-CM | POA: Diagnosis present

## 2024-01-19 DIAGNOSIS — J4541 Moderate persistent asthma with (acute) exacerbation: Secondary | ICD-10-CM

## 2024-01-19 MED ORDER — IPRATROPIUM-ALBUTEROL 0.5-2.5 (3) MG/3ML IN SOLN
3.0000 mL | Freq: Once | RESPIRATORY_TRACT | Status: AC
  Administered 2024-01-19: 3 mL via RESPIRATORY_TRACT
  Filled 2024-01-19: qty 3

## 2024-01-19 MED ORDER — PREDNISONE 20 MG PO TABS
60.0000 mg | ORAL_TABLET | Freq: Once | ORAL | Status: AC
  Administered 2024-01-19: 60 mg via ORAL
  Filled 2024-01-19: qty 3

## 2024-01-19 MED ORDER — PREDNISONE 50 MG PO TABS: 50.0000 mg | ORAL_TABLET | Freq: Every day | ORAL | 0 refills | Status: AC

## 2024-01-19 NOTE — Telephone Encounter (Signed)
 Can we please do this PA ?

## 2024-01-19 NOTE — Telephone Encounter (Signed)
 Pharmacy Patient Advocate Encounter  Received notification from [facility] MEDICAID that Prior Authorization for Trelegy Ellipta  100-62.5-25mcg has been DENIED.  See denial reason below. No denial letter attached in CMM. Will attach denial letter to Media tab once received.   PA #/Case ID/Reference #: [id number]

## 2024-01-19 NOTE — ED Triage Notes (Signed)
 Pt ambulatory to triage with complaints of shortness of breath, wheezing, and a tight feeling in the chest. Pt reports increased use of inhaler. PT was seen by PCP office yesterday, but has not improved.

## 2024-01-19 NOTE — Discharge Instructions (Signed)
 Please follow-up with your primary doctors and pulmonologist.  We are prescribing you steroids to take to help with your breathing.  Return for fevers, chills, chest pain, difficulty breathing, worsening shortness of breath, or any new or worsening symptoms that are concerning to you.

## 2024-01-19 NOTE — ED Provider Notes (Signed)
 [facility] AT [facility] Provider Note   CSN: [id number] Arrival date & time: 01/19/24  0900     Patient presents with: Shortness of Breath   Joseph Ferrell is a 33 y.o. male.   This is a 33 year old male history of asthma presenting emergency department for asthma exacerbation.  Reports some rhinorrhea and congestion for the past few days.  Has been using his inhaler more frequently.  Feels that his chest is not particularly tight at the moment, but does have some minor shortness of breath and tightness.   Shortness of Breath      Prior to Admission medications   Medication Sig Start Date End Date Taking? Authorizing Provider  albuterol  (VENTOLIN  HFA) 108 (90 Base) MCG/ACT inhaler INHALE 2 PUFFS BY MOUTH EVERY 6 HOURS AS NEEDED FOR WHEEZING OR SHORTNESS OF BREATH 02/09/23   Joshua Debby CROME, MD  Albuterol -Budesonide  (AIRSUPRA ) 90-80 MCG/ACT AERO Inhale 2 puffs into the lungs 4 (four) times daily as needed. 12/19/23   Joshua Debby CROME, MD  amLODipine (NORVASC) 5 MG tablet Take 1 tablet (5 mg total) by mouth daily. 12/19/23   Joshua Debby CROME, MD  Cholecalciferol (VITAMIN D3) 50 MCG (2000 UT) TABS Take by mouth daily.    [provider]  Fluticasone -Umeclidin-Vilant (TRELEGY ELLIPTA ) 100-62.5-25 MCG/ACT AEPB Inhale 1 puff into the lungs daily. 01/18/24   Purcell Emil Schanz, MD  triamterene-hydrochlorothiazide  (DYAZIDE) 37.5-25 MG capsule Take 1 each (1 capsule total) by mouth daily. 12/19/23   Joshua Debby CROME, MD  vitamin B-12 (CYANOCOBALAMIN ) 50 MCG tablet Take 50 mcg by mouth daily.    [provider]    Allergies: Patient has no known allergies.    Review of Systems  Respiratory:  Positive for shortness of breath.     Updated Vital Signs BP (!) 158/98 (BP Location: Left Arm)   Pulse (!) 108   Temp 98.4 F (36.9 C) (Oral)   Resp 18   Ht 5' 9 (1.753 m)   Wt 113.4 kg   SpO2 95%   BMI 36.92 kg/m   Physical  Exam Vitals and nursing note reviewed.  HENT:     Head: Normocephalic.  Pulmonary:     Effort: No accessory muscle usage or respiratory distress.     Breath sounds: Wheezing present.  Skin:    General: Skin is warm.     Capillary Refill: Capillary refill takes less than 2 seconds.  Neurological:     Mental Status: He is alert and oriented to person, place, and time.  Psychiatric:        Mood and Affect: Mood normal.        Behavior: Behavior normal.     (all labs ordered are listed, but only abnormal results are displayed) Labs Reviewed - No data to display  EKG: None  Radiology: DG Chest 2 View Result Date: 01/18/2024 CLINICAL DATA:  Two-week history of cough and wheezing EXAM: CHEST - 2 VIEW COMPARISON:  Chest radiograph dated 03/20/2013 FINDINGS: Normal lung volumes. No focal consolidations. No pleural effusion or pneumothorax. The heart size and mediastinal contours are within normal limits. No acute osseous abnormality. IMPRESSION: No active cardiopulmonary disease. Electronically Signed   By: Limin  Xu M.D.   On: 01/18/2024 14:21     Procedures   Medications Ordered in the ED  ipratropium-albuterol  (DUONEB) 0.5-2.5 (3) MG/3ML nebulizer solution 3 mL (has no administration in time range)  predniSONE  (DELTASONE ) tablet 60 mg (has no administration in time  range)                                    Medical Decision Making This is a 33 year old history of asthma presenting emergency department with shortness of breath.  He is clinically well-appearing not in respiratory distress, speaking in full sentences.  Does have some minor end expiratory wheezing in his lower lungs on exam.  Per chart review had chest x-ray yesterday which was normal.  Will give DuoNeb and prednisone  for a few days as he does note some congestion rhinorrhea.  Suspect viral URI exacerbating his underlying asthma.  Discussed follow-up with his primary doctors.  He reports that he recently saw them and  was put on Trelegy, but has not picked it up and used it.  Encouraged him to do so.  Risk Prescription drug management.       Final diagnoses:  None    ED Discharge Orders     None          Neysa Caron PARAS, [place] 01/19/24 9068

## 2024-01-19 NOTE — Telephone Encounter (Signed)
 Pharmacy Patient Advocate Encounter   Received notification from Patient Advice Request messages that prior authorization for Trelegy Ellipta  100-62.5-25mcgis required/requested.   Insurance verification completed.   The patient is insured through [facility] MEDICAID.   Per test claim: PA required; PA submitted to above mentioned insurance via Latent Key/confirmation #/EOC [id number] Status is pending

## 2024-01-19 NOTE — Telephone Encounter (Signed)
 Can we send the advair inhaler for him to try for the sake of his insurance company so that way we can resend the PA and show them that he tried and failed it so we can get his Trelegy inhaler approved ?

## 2024-01-19 NOTE — Telephone Encounter (Signed)
 Mother of the patient called. Waiting for the TRELEGY ELLIPTA  inhaler to have Prior Authorization approved to be sent to the pharmacy.

## 2024-01-19 NOTE — Telephone Encounter (Signed)
 This RN cannot schedule new patient appointments. Patient was just discharged from ED, no worsening symptoms. Advised PAS to set up NP appointment per workflow from TL.   Copied from CRM #[id number]. Topic: Clinical - Red Word Triage >> Jan 19, 2024 10:30 AM Nathanel DEL wrote: Red Word that prompted transfer to Nurse Triage: asthma flare up, just left ED advised to see pulm, he could not breathe Mom on phone, [facility]

## 2024-01-20 ENCOUNTER — Other Ambulatory Visit: Payer: Self-pay | Admitting: Internal Medicine

## 2024-01-20 DIAGNOSIS — J4520 Mild intermittent asthma, uncomplicated: Secondary | ICD-10-CM

## 2024-01-20 MED ORDER — FLUTICASONE-SALMETEROL 100-50 MCG/ACT IN AEPB
1.0000 | INHALATION_SPRAY | Freq: Two times a day (BID) | RESPIRATORY_TRACT | 1 refills | Status: DC
  Filled 2024-02-26: qty 120, 60d supply, fill #0

## 2024-01-22 NOTE — Progress Notes (Signed)
 Called and spoke with patient mom who I went over results with she verbalized understood and there were no questions or concerns at this time

## 2024-01-23 ENCOUNTER — Other Ambulatory Visit: Payer: Self-pay

## 2024-01-23 MED ORDER — BUDESONIDE-FORMOTEROL FUMARATE 80-4.5 MCG/ACT IN AERO
2.0000 | INHALATION_SPRAY | Freq: Two times a day (BID) | RESPIRATORY_TRACT | 3 refills | Status: DC
  Filled 2024-01-23: qty 10.2, 30d supply, fill #0

## 2024-01-23 MED ORDER — VALSARTAN 160 MG PO TABS
160.0000 mg | ORAL_TABLET | Freq: Every day | ORAL | 1 refills | Status: DC
  Filled 2024-01-23: qty 90, 90d supply, fill #0

## 2024-01-23 MED ORDER — TRELEGY ELLIPTA 100-62.5-25 MCG/ACT IN AEPB: 1.0000 | INHALATION_SPRAY | Freq: Every day | RESPIRATORY_TRACT | 11 refills | Status: DC

## 2024-01-23 MED ORDER — INDAPAMIDE 1.25 MG PO TABS
1.2500 mg | ORAL_TABLET | Freq: Every day | ORAL | 1 refills | Status: DC
  Filled 2024-02-01 (×2): qty 90, 90d supply, fill #0

## 2024-01-31 ENCOUNTER — Other Ambulatory Visit: Payer: Self-pay

## 2024-01-31 DIAGNOSIS — I861 Scrotal varices: Secondary | ICD-10-CM | POA: Diagnosis not present

## 2024-02-01 ENCOUNTER — Other Ambulatory Visit: Payer: Self-pay | Admitting: Internal Medicine

## 2024-02-01 ENCOUNTER — Other Ambulatory Visit: Payer: Self-pay

## 2024-02-01 DIAGNOSIS — I10 Essential (primary) hypertension: Secondary | ICD-10-CM

## 2024-02-02 ENCOUNTER — Other Ambulatory Visit: Payer: Self-pay

## 2024-02-05 ENCOUNTER — Other Ambulatory Visit (HOSPITAL_COMMUNITY): Payer: Self-pay

## 2024-02-05 ENCOUNTER — Other Ambulatory Visit: Payer: Self-pay

## 2024-02-05 ENCOUNTER — Ambulatory Visit: Admitting: Pulmonary Disease

## 2024-02-05 ENCOUNTER — Ambulatory Visit: Payer: Self-pay

## 2024-02-05 ENCOUNTER — Encounter: Payer: Self-pay | Admitting: Pulmonary Disease

## 2024-02-05 VITALS — BP 135/89 | HR 94 | Ht 70.0 in | Wt 250.0 lb

## 2024-02-05 DIAGNOSIS — J4541 Moderate persistent asthma with (acute) exacerbation: Secondary | ICD-10-CM

## 2024-02-05 MED ORDER — TRIAMTERENE-HCTZ 37.5-25 MG PO CAPS
1.0000 | ORAL_CAPSULE | Freq: Every day | ORAL | 0 refills | Status: AC
  Filled 2024-02-05 – 2024-04-01 (×2): qty 90, 90d supply, fill #0

## 2024-02-05 MED ORDER — IPRATROPIUM-ALBUTEROL 0.5-2.5 (3) MG/3ML IN SOLN
3.0000 mL | RESPIRATORY_TRACT | 6 refills | Status: AC | PRN
  Filled 2024-02-05 – 2024-02-26 (×4): qty 360, 20d supply, fill #0

## 2024-02-05 NOTE — Telephone Encounter (Signed)
 FYI Only or Action Required?: Action required by provider: clinical question for provider and update on patient condition.  Patient was last seen in primary care on 01/18/2024 by Joseph Emil Schanz, MD.  Called Nurse Triage reporting Palpitations and Hypertension.  Symptoms began several weeks ago.  Interventions attempted: Other: holding amlodipine and dyazide today.  Symptoms are: heart palpitations feels like heart skips a beat lasts a couple minutes stable. Not present now.  Triage Disposition: See HCP Within 4 Hours (Or PCP Triage)  Patient/caregiver understands and will follow disposition?: No, wishes to speak with PCP        Copied from CRM #[id number]. Topic: Clinical - Medical Advice >> Feb 05, 2024 10:29 AM [name] wrote: Reason for CRM: patient mom Joseph Ferrell calling in Symptoms: -patient has high BP -on 12/20/23 taking new medication triamterene-hydrochlorothiazide  (DYAZIDE) 37.5-25 MG capsule and amLODipine (NORVASC) 5 MG tablet  -patient noticed heart kinda skips a beat when he gets excited -doesn't feel this when patient is resting  -over is running pretty good no this medication appt at plumnologist 02/05/24 this morning BP 138/87   -no vision, headaches, tingling, numbness   Patient was informed they will receive a call back by the end of business day Reason for Disposition  Taking water pill (i.e., diuretic) or heart medication (e.g., digoxin)  Answer Assessment - Initial Assessment Questions 1. DESCRIPTION: Please describe your heart rate or heartbeat that you are having (e.g., fast/slow, regular/irregular, skipped or extra beats, palpitations)     Feels like heart skips a beat.  2. ONSET: When did it start? (e.g., minutes, hours, days)      Couple weeks ago.  3. DURATION: How long does it last (e.g., seconds, minutes, hours)     Couple of minutes.  4. PATTERN Does it come and go, or has it been constant since it started?  Does it get worse  with exertion?   Are you feeling it now?     Comes and goes. Experiences when getting excited, examples playing video games or watching sports games. Does not get worse or happen with exertion. Not present now.  5. TAP: Using your hand, can you tap out what you are feeling on a chair or table in front of you, so that I can hear? Note: Not all patients can do this.       N/A.  6. HEART RATE: Can you tell me your heart rate? How many beats in 15 seconds?  Note: Not all patients can do this.       94.  7. RECURRENT SYMPTOM: Have you ever had this before? If Yes, ask: When was the last time? and What happened that time?      No.  8. CAUSE: What do you think is causing the palpitations?     Thinks this is from new BP meds started on 12/23/23.  9. CARDIAC HISTORY: Do you have any history of heart disease? (e.g., heart attack, angina, bypass surgery, angioplasty, arrhythmia)      No.  10. OTHER SYMPTOMS: Do you have any other symptoms? (e.g., dizziness, chest pain, sweating, difficulty breathing)       Denies chest pain, SOB, dizziness, nausea, vomiting, sweating. Blood pressure this morning at pulmonologist 135/89 per mother and mother has BP log with SBP 120-130s and DBP 70-80s. She states pulse has been running high 90s and 100s.  Protocols used: Heart Rate and Heartbeat Questions-A-AH

## 2024-02-05 NOTE — Patient Instructions (Signed)
 Nice to meet you  I am glad your asthma has started been well-controlled  Continue Advair 1 puff twice a day every day.  Rinse her mouth out with water after every use.  This is your maintenance inhaler every day inhaler to keep things at baseline well-controlled.  In addition, use Airsupra  2 puffs every 4 hours as needed or DuoNebs nebulized every 4 hours as needed for shortness of breath, or wheeze, or cough.  I anticipate things will continue to be well-controlled on this regimen.  Send me a message if you feel like the Advair strength should be increased to help your day-to-day symptoms.  Return to clinic in 6 months or sooner as needed with Dr. Annella

## 2024-02-05 NOTE — Progress Notes (Signed)
 @Patient  ID: Joseph Ferrell, male    DOB: [birth year], 33 y.o.   MRN: [id number]  Chief Complaint  Patient presents with   Consult    Pt states     Referring provider: Joshua Debby CROME, MD  HPI:   33 y.o. man history of asthma who were seen in follow-up for recent ED evaluation with asthma exacerbation.  Multiple PCP notes reviewed.  ED note 01/19/2024 reviewed.  Patient is longstanding history of asthma.  Historically very well-controlled per his report.  Has been on manage inhalers including Symbicort  in the recent past.  His rescue indications, Airsupra , was added.  This cause some confusion in terms of what to take.  As such his Symbicort  usage decreased.  In the setting he had worsening symptoms.  Wheeze shortness of breath.  Prompted PCP visit 01/18/2024.  Chest x-ray obtained at that time on my review and interpretation revealed clear lungs bilaterally with mild hyperinflation on the lateral with increased lucency between sternum and heart.  Unfortunate, his symptoms worsened and required ED visit 01/19/2024.  He was treated with nebulizer medication and a dose of prednisone .  His symptoms markedly improved.  Multiple inhaler changes were made in the interim.  We discussed at length current regimen.  Plans for regimen moving forward.  Clarifying usage of inhalers etc.  We would not use Trelegy, this is not on Medicaid formulary.  His Symbicort  was discontinued as currently is not using this.  His current maintenance inhaler is Advair discus low-dose.  He reports this is controlling his day-to-day symptoms quite well currently.  We will continue this.  He and mother request nebulizer to have at home and for rescue in case he gets sick which I think is very reasonable as it seems nebulized medication in the ED recently provided significant symptomatic relief.  Questionaires / Pulmonary Flowsheets:   ACT:      No data to display          MMRC:     No data to display           Epworth:      No data to display          Tests:   FENO:  No results found for: NITRICOXIDE  PFT:     No data to display          WALK:      No data to display          Imaging: Personally reviewed and as per EMR and discussion in this note DG Chest 2 View Result Date: 01/18/2024 CLINICAL DATA:  Two-week history of cough and wheezing EXAM: CHEST - 2 VIEW COMPARISON:  Chest radiograph dated 03/20/2013 FINDINGS: Normal lung volumes. No focal consolidations. No pleural effusion or pneumothorax. The heart size and mediastinal contours are within normal limits. No acute osseous abnormality. IMPRESSION: No active cardiopulmonary disease. Electronically Signed   By: Limin  Xu M.D.   On: 01/18/2024 14:21    Lab Results: Personally reviewed and as per EMR and discussion in this note CBC    Component Value Date/Time   WBC 6.0 12/18/2023 1539   RBC 5.04 12/18/2023 1539   HGB 15.9 12/18/2023 1539   HCT 47.0 12/18/2023 1539   PLT 194.0 12/18/2023 1539   MCV 93.2 12/18/2023 1539   MCHC 33.9 12/18/2023 1539   RDW 13.8 12/18/2023 1539   LYMPHSABS 2.3 12/18/2023 1539   MONOABS 0.6 12/18/2023 1539   EOSABS 0.3 12/18/2023 1539  BASOSABS 0.0 12/18/2023 1539    BMET    Component Value Date/Time   NA 135 12/18/2023 1539   K 3.7 12/18/2023 1539   CL 99 12/18/2023 1539   CO2 27 12/18/2023 1539   GLUCOSE 85 12/18/2023 1539   BUN 10 12/18/2023 1539   CREATININE 1.01 12/18/2023 1539   CALCIUM 9.8 12/18/2023 1539    BNP No results found for: BNP  ProBNP    Component Value Date/Time   PROBNP 7.0 02/09/2023 0958    Specialty Problems       Pulmonary Problems   Asthma, intermittent   Moderate persistent asthma with acute exacerbation    No Known Allergies  Immunization History  Administered Date(s) Administered   Influenza, Seasonal, Injecte, Preservative Fre 01/18/2024   Influenza,inj,Quad PF,6+ Mos 05/06/2013, 05/23/2014, 11/27/2018, 02/05/2021    Pneumococcal Polysaccharide-23 05/06/2013   Tdap 11/19/2007, 11/27/2018    Past Medical History:  Diagnosis Date   Allergy    Asthma     Tobacco History: Social History   Tobacco Use  Smoking Status Some Days   Types: Cigars  Smokeless Tobacco Never  Tobacco Comments   uses electronic cig with vapor   Ready to quit: Not Answered Counseling given: Not Answered Tobacco comments: uses electronic cig with vapor   Continue to not smoke  Outpatient Encounter Medications as of 02/05/2024  Medication Sig   albuterol  (VENTOLIN  HFA) 108 (90 Base) MCG/ACT inhaler INHALE 2 PUFFS BY MOUTH EVERY 6 HOURS AS NEEDED FOR WHEEZING OR SHORTNESS OF BREATH   Albuterol -Budesonide  (AIRSUPRA ) 90-80 MCG/ACT AERO Inhale 2 puffs into the lungs 4 (four) times daily as needed.   amLODipine (NORVASC) 5 MG tablet Take 1 tablet (5 mg total) by mouth daily.   Cholecalciferol (VITAMIN D3) 50 MCG (2000 UT) TABS Take by mouth daily.   fluticasone -salmeterol (ADVAIR) 100-50 MCG/ACT AEPB Inhale 1 puff into the lungs 2 (two) times daily.   indapamide  (LOZOL ) 1.25 MG tablet Take 1 tablet (1.25 mg total) by mouth daily.   ipratropium-albuterol  (DUONEB) 0.5-2.5 (3) MG/3ML SOLN Take 3 mLs by nebulization every 4 (four) hours as needed.   vitamin B-12 (CYANOCOBALAMIN ) 50 MCG tablet Take 50 mcg by mouth daily.   [DISCONTINUED] Fluticasone -Umeclidin-Vilant (TRELEGY ELLIPTA ) 100-62.5-25 MCG/ACT AEPB Inhale 1 puff into the lungs daily.   [DISCONTINUED] triamterene-hydrochlorothiazide  (DYAZIDE) 37.5-25 MG capsule Take 1 each (1 capsule total) by mouth daily.   [DISCONTINUED] budesonide -formoterol  (SYMBICORT ) 80-4.5 MCG/ACT inhaler Inhale 2 puffs into the lungs 2 (two) times daily. (Patient not taking: Reported on 02/05/2024)   No facility-administered encounter medications on file as of 02/05/2024.     Review of Systems  Review of Systems  No chest pain exertion.  Orthopnea or PND.  Comprehensive review of systems  otherwise negative. Physical Exam  BP (!) 146/88   Pulse 95   Ht 5' 10 (1.778 m) Comment: per pt  Wt 250 lb (113.4 kg)   SpO2 95%   BMI 35.87 kg/m   Wt Readings from Last 5 Encounters:  02/05/24 250 lb (113.4 kg)  01/19/24 250 lb (113.4 kg)  01/18/24 246 lb (111.6 kg)  12/18/23 252 lb (114.3 kg)  02/09/23 245 lb 12.8 oz (111.5 kg)    BMI Readings from Last 5 Encounters:  02/05/24 35.87 kg/m  01/19/24 36.92 kg/m  01/18/24 36.86 kg/m  12/18/23 37.76 kg/m  02/09/23 36.83 kg/m     Physical Exam General: Sitting in chair, no acute distress Eyes: EOMI, icterus Neck: Supple, JVP Pulmonary: Clear, normal work  of breathing Abdomen: Nondistended, bowel sounds present Neuro: Normal gait, no weakness Psych: Normal mood, full affect   Assessment & Plan:   Moderate persistent asthma: Historically well-controlled.  Recent flare prompting ED visit earlier this month, 01/2024 in the setting of lack of clarity, following up on the maintenance inhaler regimen.  Improved with breathing treatment and single dose of prednisone .  Maintained improvement with low-dose Advair discus 1 puff twice daily.  Encouraged to continue this.  Continue Airsupra  as rescue.  New prescription for DuoNebs to be used with new order nebulizer machine for rescue purposes as well.  Notably, on review of labs, eosinophils mildly elevated 300.  Given well-controlled disease do not anticipate need for biologic therapy but certainly could be an option in the future if symptoms worsen.  Hyperinflation: On chest x-ray on the lateral.  Very mild.  Related to underlying small airways disease.  Treatment of asthma as above.   Return in about 6 months (around 08/04/2024) for f/u Dr. Annella.   Donnice JONELLE Annella, MD 02/05/2024

## 2024-02-06 ENCOUNTER — Telehealth: Payer: Self-pay

## 2024-02-06 ENCOUNTER — Other Ambulatory Visit (HOSPITAL_COMMUNITY): Payer: Self-pay

## 2024-02-06 ENCOUNTER — Other Ambulatory Visit: Payer: Self-pay

## 2024-02-06 DIAGNOSIS — J4541 Moderate persistent asthma with (acute) exacerbation: Secondary | ICD-10-CM | POA: Diagnosis not present

## 2024-02-06 NOTE — Telephone Encounter (Signed)
 Copied from CRM [id number]. Topic: Clinical - Medical Advice >> Feb 06, 2024  3:19 PM Shereese L wrote: Reason for CRM: Patient mother is awaiting a return call due to the fact that the patient is having side affects.

## 2024-02-07 ENCOUNTER — Encounter: Payer: Self-pay | Admitting: Internal Medicine

## 2024-02-07 NOTE — Telephone Encounter (Unsigned)
 Copied from CRM [id number]. Topic: General - Call Back - No Documentation >> Feb 07, 2024  3:08 PM Alfonso ORN wrote: Reason for CRM: pt mom called to f/u on callback awaiting response on medication change due to side effects. Contacted cal who advised another message be sent. Please contact pt mom as pt hasn't been taking medication for two days now.

## 2024-02-08 NOTE — Telephone Encounter (Signed)
 Please advise

## 2024-02-09 NOTE — Telephone Encounter (Signed)
 Please advise. Patient hasn't taken his medication since Sunday. He seen the pulmonologist on Monday and his BP was good. Patients mother has been taking his BP daily and his readings are still within normal limits. Please advise if he needs to start his medication again and on the palpitations. Please review other messages sent in regards to this.

## 2024-02-12 ENCOUNTER — Other Ambulatory Visit: Payer: Self-pay

## 2024-02-12 ENCOUNTER — Ambulatory Visit (INDEPENDENT_AMBULATORY_CARE_PROVIDER_SITE_OTHER): Admitting: Internal Medicine

## 2024-02-12 VITALS — BP 132/82 | HR 78 | Temp 98.3°F | Resp 16 | Ht 70.0 in | Wt 256.4 lb

## 2024-02-12 DIAGNOSIS — E781 Pure hyperglyceridemia: Secondary | ICD-10-CM | POA: Diagnosis not present

## 2024-02-12 DIAGNOSIS — Z23 Encounter for immunization: Secondary | ICD-10-CM | POA: Diagnosis not present

## 2024-02-12 DIAGNOSIS — I10 Essential (primary) hypertension: Secondary | ICD-10-CM

## 2024-02-12 DIAGNOSIS — Z114 Encounter for screening for human immunodeficiency virus [HIV]: Secondary | ICD-10-CM | POA: Insufficient documentation

## 2024-02-12 LAB — LIPID PANEL
Cholesterol: 166 mg/dL (ref 0–200)
HDL: 36.9 mg/dL — ABNORMAL LOW (ref >39.00)
LDL Cholesterol: 102 mg/dL — ABNORMAL HIGH (ref 0–99)
NonHDL: 129.58
Total CHOL/HDL Ratio: 5
Triglycerides: 138 mg/dL (ref 0.0–149.0)
VLDL: 27.6 mg/dL (ref 0.0–40.0)

## 2024-02-12 NOTE — Patient Instructions (Addendum)
 Please come back in one month for the second Hep B vaccine. Come back in 6 months for the second Hep A vaccine.   Hypertension, Adult High blood pressure (hypertension) is when the force of blood pumping through the arteries is too strong. The arteries are the blood vessels that carry blood from the heart throughout the body. Hypertension forces the heart to work harder to pump blood and may cause arteries to become narrow or stiff. Untreated or uncontrolled hypertension can lead to a heart attack, heart failure, a stroke, kidney disease, and other problems. A blood pressure reading consists of a higher number over a lower number. Ideally, your blood pressure should be below 120/80. The first (top) number is called the systolic pressure. It is a measure of the pressure in your arteries as your heart beats. The second (bottom) number is called the diastolic pressure. It is a measure of the pressure in your arteries as the heart relaxes. What are the causes? The exact cause of this condition is not known. There are some conditions that result in high blood pressure. What increases the risk? Certain factors may make you more likely to develop high blood pressure. Some of these risk factors are under your control, including: Smoking. Not getting enough exercise or physical activity. Being overweight. Having too much fat, sugar, calories, or salt (sodium) in your diet. Drinking too much alcohol. Other risk factors include: Having a personal history of heart disease, diabetes, high cholesterol, or kidney disease. Stress. Having a family history of high blood pressure and high cholesterol. Having obstructive sleep apnea. Age. The risk increases with age. What are the signs or symptoms? High blood pressure may not cause symptoms. Very high blood pressure (hypertensive crisis) may cause: Headache. Fast or irregular heartbeats (palpitations). Shortness of breath. Nosebleed. Nausea and  vomiting. Vision changes. Severe chest pain, dizziness, and seizures. How is this diagnosed? This condition is diagnosed by measuring your blood pressure while you are seated, with your arm resting on a flat surface, your legs uncrossed, and your feet flat on the floor. The cuff of the blood pressure monitor will be placed directly against the skin of your upper arm at the level of your heart. Blood pressure should be measured at least twice using the same arm. Certain conditions can cause a difference in blood pressure between your right and left arms. If you have a high blood pressure reading during one visit or you have normal blood pressure with other risk factors, you may be asked to: Return on a different day to have your blood pressure checked again. Monitor your blood pressure at home for 1 week or longer. If you are diagnosed with hypertension, you may have other blood or imaging tests to help your health care provider understand your overall risk for other conditions. How is this treated? This condition is treated by making healthy lifestyle changes, such as eating healthy foods, exercising more, and reducing your alcohol intake. You may be referred for counseling on a healthy diet and physical activity. Your health care provider may prescribe medicine if lifestyle changes are not enough to get your blood pressure under control and if: Your systolic blood pressure is above 130. Your diastolic blood pressure is above 80. Your personal target blood pressure may vary depending on your medical conditions, your age, and other factors. Follow these instructions at home: Eating and drinking  Eat a diet that is high in fiber and potassium, and low in sodium, added sugar, and  fat. An example of this eating plan is called the DASH diet. DASH stands for Dietary Approaches to Stop Hypertension. To eat this way: Eat plenty of fresh fruits and vegetables. Try to fill one half of your plate at each  meal with fruits and vegetables. Eat whole grains, such as whole-wheat pasta, brown rice, or whole-grain bread. Fill about one fourth of your plate with whole grains. Eat or drink low-fat dairy products, such as skim milk or low-fat yogurt. Avoid fatty cuts of meat, processed or cured meats, and poultry with skin. Fill about one fourth of your plate with lean proteins, such as fish, chicken without skin, beans, eggs, or tofu. Avoid pre-made and processed foods. These tend to be higher in sodium, added sugar, and fat. Reduce your daily sodium intake. Many people with hypertension should eat less than 1,500 mg of sodium a day. Do not drink alcohol if: Your health care provider tells you not to drink. You are pregnant, may be pregnant, or are planning to become pregnant. If you drink alcohol: Limit how much you have to: 0-1 drink a day for women. 0-2 drinks a day for men. Know how much alcohol is in your drink. In the [place] one drink equals one 12 oz bottle of beer (355 mL), one 5 oz glass of wine (148 mL), or one 1 oz glass of hard liquor (44 mL). Lifestyle  Work with your health care provider to maintain a healthy body weight or to lose weight. Ask what an ideal weight is for you. Get at least 30 minutes of exercise that causes your heart to beat faster (aerobic exercise) most days of the week. Activities may include walking, swimming, or biking. Include exercise to strengthen your muscles (resistance exercise), such as Pilates or lifting weights, as part of your weekly exercise routine. Try to do these types of exercises for 30 minutes at least 3 days a week. Do not use any products that contain nicotine or tobacco. These products include cigarettes, chewing tobacco, and vaping devices, such as e-cigarettes. If you need help quitting, ask your health care provider. Monitor your blood pressure at home as told by your health care provider. Keep all follow-up visits. This is  important. Medicines Take over-the-counter and prescription medicines only as told by your health care provider. Follow directions carefully. Blood pressure medicines must be taken as prescribed. Do not skip doses of blood pressure medicine. Doing this puts you at risk for problems and can make the medicine less effective. Ask your health care provider about side effects or reactions to medicines that you should watch for. Contact a health care provider if you: Think you are having a reaction to a medicine you are taking. Have headaches that keep coming back (recurring). Feel dizzy. Have swelling in your ankles. Have trouble with your vision. Get help right away if you: Develop a severe headache or confusion. Have unusual weakness or numbness. Feel faint. Have severe pain in your chest or abdomen. Vomit repeatedly. Have trouble breathing. These symptoms may be an emergency. Get help right away. Call 911. Do not wait to see if the symptoms will go away. Do not drive yourself to the hospital. Summary Hypertension is when the force of blood pumping through your arteries is too strong. If this condition is not controlled, it may put you at risk for serious complications. Your personal target blood pressure may vary depending on your medical conditions, your age, and other factors. For most people, a normal blood  pressure is less than 120/80. Hypertension is treated with lifestyle changes, medicines, or a combination of both. Lifestyle changes include losing weight, eating a healthy, low-sodium diet, exercising more, and limiting alcohol. This information is not intended to replace advice given to you by your health care provider. Make sure you discuss any questions you have with your health care provider. Document Revised: 01/12/2021 Document Reviewed: 01/12/2021 Elsevier Patient Education  2024 [employer].

## 2024-02-12 NOTE — Progress Notes (Unsigned)
 Subjective:  Patient ID: Joseph Ferrell, male    DOB: [birth year]  Age: 33 y.o. MRN: [id number]  CC: Hypertension and Hyperlipidemia   HPI Joseph Ferrell presents for f/up ---  Discussed the use of AI scribe software for clinical note transcription with the patient, who gave verbal consent to proceed.  History of Present Illness Joseph Ferrell is a 33 year old male with asthma who presents with recent asthma flare-up and concerns about heart palpitations.  He experienced a recent asthma flare-up due to inconsistent use of his prescribed medication, Symbicort , mistakenly using Air Supra instead. Following the flare-up, he visited the ER where he received a nebulizer treatment and prednisone , which alleviated his symptoms. He has since been using Advair twice daily, which has improved his condition.  He reports experiencing heart palpitations over the past week, described as a 'skipping of the beat'. He initially suspected his new blood pressure medication might be the cause and stopped taking it a week ago. The palpitations occurred during activities like competitive gaming or watching intense sports events. No associated symptoms such as weakness, dizziness, lightheadedness, chest pain, shortness of breath, or leg swelling. He has not resumed the blood pressure medication since stopping it.  He has made lifestyle changes including quitting smoking THC recently and alcohol nearly a year ago. He has adjusted his diet to reduce salt intake and increase fruit consumption. His blood pressure was previously elevated, possibly due to stress related to a urology consultation.     Outpatient Medications Prior to Visit  Medication Sig Dispense Refill   albuterol  (VENTOLIN  HFA) 108 (90 Base) MCG/ACT inhaler INHALE 2 PUFFS BY MOUTH EVERY 6 HOURS AS NEEDED FOR WHEEZING OR SHORTNESS OF BREATH 18 g 0   Albuterol -Budesonide  (AIRSUPRA ) 90-80 MCG/ACT AERO Inhale 2 puffs into the lungs 4 (four) times daily  as needed. 32.1 g 1   amLODipine  (NORVASC ) 5 MG tablet Take 1 tablet (5 mg total) by mouth daily. 90 tablet 0   Cholecalciferol (VITAMIN D3) 50 MCG (2000 UT) TABS Take by mouth daily.     fluticasone -salmeterol (ADVAIR) 100-50 MCG/ACT AEPB Inhale 1 puff into the lungs 2 (two) times daily. 120 each 1   ipratropium-albuterol  (DUONEB) 0.5-2.5 (3) MG/3ML SOLN Take 3 mLs by nebulization every 4 (four) hours as needed. 360 mL 6   triamterene -hydrochlorothiazide  (DYAZIDE ) 37.5-25 MG capsule Take 1 each (1 capsule total) by mouth daily. 90 capsule 0   vitamin B-12 (CYANOCOBALAMIN ) 50 MCG tablet Take 50 mcg by mouth daily.     indapamide  (LOZOL ) 1.25 MG tablet Take 1 tablet (1.25 mg total) by mouth daily. 90 tablet 1   No facility-administered medications prior to visit.    ROS Review of Systems  Objective:  BP 132/82 (BP Location: Left Arm, Patient Position: Sitting, Cuff Size: Normal)   Pulse 78   Temp 98.3 F (36.8 C) (Oral)   Resp 16   Ht 5' 10 (1.778 m)   Wt 256 lb 6.4 oz (116.3 kg)   SpO2 97%   BMI 36.79 kg/m   BP Readings from Last 3 Encounters:  02/12/24 132/82  02/05/24 135/89  01/19/24 (!) 147/103    Wt Readings from Last 3 Encounters:  02/12/24 256 lb 6.4 oz (116.3 kg)  02/05/24 250 lb (113.4 kg)  01/19/24 250 lb (113.4 kg)    Physical Exam  Lab Results  Component Value Date   WBC 6.0 12/18/2023   HGB 15.9 12/18/2023   HCT 47.0 12/18/2023  PLT 194.0 12/18/2023   GLUCOSE 85 12/18/2023   CHOL 164 02/09/2023   TRIG 108.0 02/09/2023   HDL 42.40 02/09/2023   LDLDIRECT 102.0 02/07/2022   LDLCALC 100 (H) 02/09/2023   ALT 44 12/18/2023   AST 21 12/18/2023   NA 135 12/18/2023   K 3.7 12/18/2023   CL 99 12/18/2023   CREATININE 1.01 12/18/2023   BUN 10 12/18/2023   CO2 27 12/18/2023   TSH 2.24 12/18/2023   INR 1.1 (H) 12/18/2023   HGBA1C 5.4 02/07/2022    No results found.  Assessment & Plan:  Pure hyperglyceridemia -     Lipid panel; Future  Essential  hypertension, benign  Encounter for screening for HIV -     HIV Antibody (routine testing w rflx); Future  Immunization due     Follow-up: Return in about 6 months (around 08/11/2024).  Debby Molt, MD

## 2024-02-13 ENCOUNTER — Ambulatory Visit: Payer: Self-pay | Admitting: Internal Medicine

## 2024-02-13 LAB — HIV ANTIBODY (ROUTINE TESTING W REFLEX)
HIV 1&2 Ab, 4th Generation: NONREACTIVE
HIV FINAL INTERPRETATION: NEGATIVE

## 2024-02-13 NOTE — Assessment & Plan Note (Signed)
 Trigs are normal now

## 2024-02-13 NOTE — Assessment & Plan Note (Signed)
BP is adequately well controlled

## 2024-02-20 ENCOUNTER — Other Ambulatory Visit: Payer: Self-pay

## 2024-02-26 ENCOUNTER — Other Ambulatory Visit: Payer: Self-pay

## 2024-02-27 ENCOUNTER — Other Ambulatory Visit: Payer: Self-pay

## 2024-03-25 ENCOUNTER — Ambulatory Visit: Admitting: Internal Medicine

## 2024-03-26 ENCOUNTER — Ambulatory Visit

## 2024-03-26 ENCOUNTER — Other Ambulatory Visit: Payer: Self-pay

## 2024-03-26 ENCOUNTER — Encounter: Payer: Self-pay | Admitting: Internal Medicine

## 2024-03-26 ENCOUNTER — Ambulatory Visit: Admitting: Internal Medicine

## 2024-03-26 VITALS — BP 134/78 | HR 90 | Temp 98.5°F | Ht 70.0 in | Wt 254.0 lb

## 2024-03-26 DIAGNOSIS — I10 Essential (primary) hypertension: Secondary | ICD-10-CM | POA: Diagnosis not present

## 2024-03-26 DIAGNOSIS — R058 Other specified cough: Secondary | ICD-10-CM | POA: Insufficient documentation

## 2024-03-26 DIAGNOSIS — R062 Wheezing: Secondary | ICD-10-CM | POA: Diagnosis not present

## 2024-03-26 DIAGNOSIS — E559 Vitamin D deficiency, unspecified: Secondary | ICD-10-CM

## 2024-03-26 MED ORDER — HYDROCODONE BIT-HOMATROP MBR 5-1.5 MG/5ML PO SOLN
5.0000 mL | Freq: Four times a day (QID) | ORAL | 0 refills | Status: AC | PRN
  Filled 2024-03-26: qty 180, 9d supply, fill #0

## 2024-03-26 MED ORDER — PREDNISONE 10 MG PO TABS
ORAL_TABLET | ORAL | 0 refills | Status: AC
  Filled 2024-03-26: qty 18, 9d supply, fill #0

## 2024-03-26 MED ORDER — DOXYCYCLINE HYCLATE 100 MG PO TABS
100.0000 mg | ORAL_TABLET | Freq: Two times a day (BID) | ORAL | 0 refills | Status: AC
  Filled 2024-03-26: qty 20, 10d supply, fill #0

## 2024-03-26 NOTE — Assessment & Plan Note (Signed)
 BP Readings from Last 3 Encounters:  03/26/24 134/78  02/12/24 132/82  02/05/24 135/89   Stable, pt to continue medical treatment norvasc  5 every day, dyazide  1 qd

## 2024-03-26 NOTE — Assessment & Plan Note (Signed)
 Mild to mod, c/w bronchitis vs pna  for antibx doxycyline 100 bid course, cough med prn, for CXR today , and to f/u any worsening symptoms or concerns

## 2024-03-26 NOTE — Patient Instructions (Signed)
 Please take all new medication as prescribed  - the antibiotic, cough medicine, prednisone   Please continue all other medications as before, including the Airsupra   Please have the pharmacy call with any other refills you may need.  Please keep your appointments with your specialists as you may have planned  Please go to the XRAY Department in the first floor for the x-ray testing  You will be contacted by phone if any changes need to be made immediately.  Otherwise, you will receive a letter about your results with an explanation, but please check with MyChart first.  Please see Dr Joshua later this month as you have planned

## 2024-03-26 NOTE — Assessment & Plan Note (Signed)
 Last vitamin D  Lab Results  Component Value Date   VD25OH 20.37 (L) 02/07/2022   Remains low, reminded to start oral replacement

## 2024-03-26 NOTE — Progress Notes (Signed)
 Patient ID: Joseph Ferrell, male   DOB: 12/29/1990, 34 y.o.   MRN: [id number]        Chief Complaint: follow up prod cough, wheezing, htn       HPI:  Joseph Ferrell is a 34 y.o. male Here with acute onset mild to mod 2-3 days ST, HA, general weakness and malaise, with prod cough greenish sputum, wheezing with sob doe mild, but Pt denies chest pain, orthopnea, PND, increased LE swelling, palpitations, dizziness or syncope.   Pt denies polydipsia, polyuria, or new focal neuro s/s.    Pt denies recent wt loss, night sweats, loss of appetite, or other constitutional symptoms       Wt Readings from Last 3 Encounters:  03/26/24 254 lb (115.2 kg)  02/12/24 256 lb 6.4 oz (116.3 kg)  02/05/24 250 lb (113.4 kg)   BP Readings from Last 3 Encounters:  03/26/24 134/78  02/12/24 132/82  02/05/24 135/89         Past Medical History:  Diagnosis Date   Allergy    Asthma    History reviewed. No pertinent surgical history.  reports that he has quit smoking. His smoking use included cigars. He has never used smokeless tobacco. He reports current alcohol use of about 15.0 standard drinks of alcohol per week. He reports current drug use. Drug: Marijuana. family history includes Alcohol abuse in his father; Hypertension in his father and mother. Allergies[1] Medications Ordered Prior to Encounter[2]      ROS:  All others reviewed and negative.  Objective        PE:  BP 134/78 (BP Location: Right Arm, Patient Position: Sitting, Cuff Size: Normal)   Pulse 90   Temp 98.5 F (36.9 C) (Oral)   Ht 5' 10 (1.778 m)   Wt 254 lb (115.2 kg)   SpO2 96%   BMI 36.45 kg/m                 Constitutional: Pt appears mild ill               HENT: Head: NCAT.                Right Ear: External ear normal.                 Left Ear: External ear normal. Bilat tm's with mild erythema.  Max sinus areas non tender.  Pharynx with mild erythema, no exudate               Eyes: . Pupils are equal, round, and reactive to  light. Conjunctivae and EOM are normal               Nose: without d/c or deformity               Neck: Neck supple. Gross normal ROM               Cardiovascular: Normal rate and regular rhythm.                 Pulmonary/Chest: Effort normal and breath sounds decreased without rales but with mild bilateral wheezing.                               Neurological: Pt is alert. At baseline orientation, motor grossly intact               Skin: Skin is warm. No rashes, no other  new lesions, LE edema - none               Psychiatric: Pt behavior is normal without agitation   Micro: none  Cardiac tracings I have personally interpreted today:  none  Pertinent Radiological findings (summarize): none   Lab Results  Component Value Date   WBC 6.0 12/18/2023   HGB 15.9 12/18/2023   HCT 47.0 12/18/2023   PLT 194.0 12/18/2023   GLUCOSE 85 12/18/2023   CHOL 166 02/12/2024   TRIG 138.0 02/12/2024   HDL 36.90 (L) 02/12/2024   LDLDIRECT 102.0 02/07/2022   LDLCALC 102 (H) 02/12/2024   ALT 44 12/18/2023   AST 21 12/18/2023   NA 135 12/18/2023   K 3.7 12/18/2023   CL 99 12/18/2023   CREATININE 1.01 12/18/2023   BUN 10 12/18/2023   CO2 27 12/18/2023   TSH 2.24 12/18/2023   INR 1.1 (H) 12/18/2023   HGBA1C 5.4 02/07/2022   Assessment/Plan:  Joseph Ferrell is a 34 y.o. Black or African American [2] male with  has a past medical history of Allergy and Asthma.  Wheezing Mild to mod, for prednisone  taper, cont home inhaler prn,  to f/u any worsening symptoms or concerns   Productive cough Mild to mod, c/w bronchitis vs pna  for antibx doxycyline 100 bid course, cough med prn, for CXR today , and to f/u any worsening symptoms or concerns   Essential hypertension, benign BP Readings from Last 3 Encounters:  03/26/24 134/78  02/12/24 132/82  02/05/24 135/89   Stable, pt to continue medical treatment norvasc  5 every day, dyazide  1 qd   Vitamin D  deficiency Last vitamin D  Lab Results   Component Value Date   VD25OH 20.37 (L) 02/07/2022   Remains low, reminded to start oral replacement  Followup: Return if symptoms worsen or fail to improve.  Lynwood Rush, MD 03/26/2024 12:46 PM [facility]     [1] No Known Allergies [2]  Current Outpatient Medications on File Prior to Visit  Medication Sig Dispense Refill   albuterol  (VENTOLIN  HFA) 108 (90 Base) MCG/ACT inhaler INHALE 2 PUFFS BY MOUTH EVERY 6 HOURS AS NEEDED FOR WHEEZING OR SHORTNESS OF BREATH 18 g 0   Albuterol -Budesonide  (AIRSUPRA ) 90-80 MCG/ACT AERO Inhale 2 puffs into the lungs 4 (four) times daily as needed. 32.1 g 1   amLODipine  (NORVASC ) 5 MG tablet Take 1 tablet (5 mg total) by mouth daily. 90 tablet 0   Cholecalciferol (VITAMIN D3) 50 MCG (2000 UT) TABS Take by mouth daily.     fluticasone -salmeterol (ADVAIR ) 100-50 MCG/ACT AEPB Inhale 1 puff into the lungs 2 (two) times daily. 120 each 1   ipratropium-albuterol  (DUONEB) 0.5-2.5 (3) MG/3ML SOLN Take 3 mLs by nebulization every 4 (four) hours as needed. 360 mL 6   triamterene -hydrochlorothiazide  (DYAZIDE ) 37.5-25 MG capsule Take 1 each (1 capsule total) by mouth daily. 90 capsule 0   vitamin B-12 (CYANOCOBALAMIN ) 50 MCG tablet Take 50 mcg by mouth daily.     No current facility-administered medications on file prior to visit.

## 2024-03-26 NOTE — Assessment & Plan Note (Signed)
 Mild to mod, for prednisone  taper, cont home inhaler prn,  to f/u any worsening symptoms or concerns

## 2024-04-01 ENCOUNTER — Other Ambulatory Visit: Payer: Self-pay

## 2024-04-02 ENCOUNTER — Other Ambulatory Visit: Payer: Self-pay | Admitting: Internal Medicine

## 2024-04-02 ENCOUNTER — Other Ambulatory Visit: Payer: Self-pay

## 2024-04-02 DIAGNOSIS — I10 Essential (primary) hypertension: Secondary | ICD-10-CM

## 2024-04-02 MED ORDER — AMLODIPINE BESYLATE 5 MG PO TABS
5.0000 mg | ORAL_TABLET | Freq: Every day | ORAL | 0 refills | Status: AC
  Filled 2024-04-02: qty 90, 90d supply, fill #0

## 2024-04-02 NOTE — Telephone Encounter (Signed)
 Requesting 90 day supply.

## 2024-04-02 NOTE — Telephone Encounter (Signed)
 Copied from CRM #[id number]. Topic: Clinical - Medication Refill >> Apr 02, 2024  3:19 PM Fonda T wrote: Medication: amLODipine  (NORVASC ) 5 MG tablet  90 day supply   Has the patient contacted their pharmacy? Yes, advised to contact office, previous rx was [facility], no longer using [facility], preferred pharmacy below   This is the patient's preferred pharmacy:  [facility] [address] [place] [address] Phone: [phone] Fax: [phone]  Is this the correct pharmacy for this prescription? Yes    Has the prescription been filled recently? Yes  Is the patient out of the medication? Yes, needing medication as soon as possible  Has the patient been seen for an appointment in the last year OR does the patient have an upcoming appointment? Yes  Can we respond through MyChart? No, follow up at ph [phone]  Agent: Please be advised that Rx refills may take up to 3 business days. We ask that you follow-up with your pharmacy.

## 2024-04-03 ENCOUNTER — Other Ambulatory Visit: Payer: Self-pay

## 2024-04-03 ENCOUNTER — Ambulatory Visit: Payer: Self-pay | Admitting: Internal Medicine

## 2024-04-17 ENCOUNTER — Ambulatory Visit

## 2024-04-17 ENCOUNTER — Ambulatory Visit: Admitting: Internal Medicine

## 2024-04-17 DIAGNOSIS — Z23 Encounter for immunization: Secondary | ICD-10-CM

## 2024-04-17 NOTE — Progress Notes (Signed)
 Patient presented in the office today for his 2nd HEP B vaccine. I administered his vaccine into his right deltoid muscle. Patient tolerated the injection well and I advised him to report to the office immediately if he notices any adverse reactions. Patient gave a verbal understanding.

## 2024-04-24 ENCOUNTER — Other Ambulatory Visit: Payer: Self-pay | Admitting: Internal Medicine

## 2024-04-24 ENCOUNTER — Other Ambulatory Visit: Payer: Self-pay

## 2024-04-24 DIAGNOSIS — J4520 Mild intermittent asthma, uncomplicated: Secondary | ICD-10-CM

## 2024-04-24 MED ORDER — FLUTICASONE-SALMETEROL 100-50 MCG/ACT IN AEPB
1.0000 | INHALATION_SPRAY | Freq: Two times a day (BID) | RESPIRATORY_TRACT | 1 refills | Status: AC
  Filled 2024-04-24 (×2): qty 60, 30d supply, fill #0

## 2024-08-13 ENCOUNTER — Ambulatory Visit: Admitting: Internal Medicine
